# Patient Record
Sex: Female | Born: 1985 | Race: White | Hispanic: No | Marital: Married | Smoking: Never smoker
Health system: Southern US, Community
[De-identification: ages and names within clinical notes are randomized; demographics above are authoritative.]

---
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
## 2005-03-15 ENCOUNTER — Ambulatory Visit (HOSPITAL_COMMUNITY): Admission: RE | Admit: 2005-03-15 | Discharge: 2005-03-15 | Payer: Self-pay | Admitting: Family Medicine

## 2005-03-15 ENCOUNTER — Ambulatory Visit: Payer: Self-pay | Admitting: Cardiology

## 2006-08-23 ENCOUNTER — Ambulatory Visit (HOSPITAL_COMMUNITY): Admission: RE | Admit: 2006-08-23 | Discharge: 2006-08-23 | Payer: Self-pay | Admitting: Obstetrics & Gynecology

## 2006-09-02 ENCOUNTER — Encounter: Admission: RE | Admit: 2006-09-02 | Discharge: 2006-09-02 | Payer: Self-pay | Admitting: Gastroenterology

## 2007-03-16 ENCOUNTER — Other Ambulatory Visit: Admission: RE | Admit: 2007-03-16 | Discharge: 2007-03-16 | Payer: Self-pay | Admitting: Obstetrics & Gynecology

## 2008-03-08 IMAGING — US US PELVIS COMPLETE MODIFY
1 series · 14 of 25 positions shown · non-contrast
Comparison: none

CLINICAL DATA: 20-year-old with left lower quadrant pain.  Dysfunctional uterine bleeding, dyspareunia.  LMP began 07/20/06 and has continued until today.
 TRANSABDOMINAL AND TRANSVAGINAL PELVIC ULTRASOUND:
TECHNIQUE: Both transabdominal and transvaginal ultrasound examinations of the pelvis were performed including evaluation of the uterus, ovaries, adnexal regions, and pelvic cul-de-sac.

[Series 1: us pelvis complete modify · 0.26mm/px · 14 of 25 slices shown]
[im 1/25]
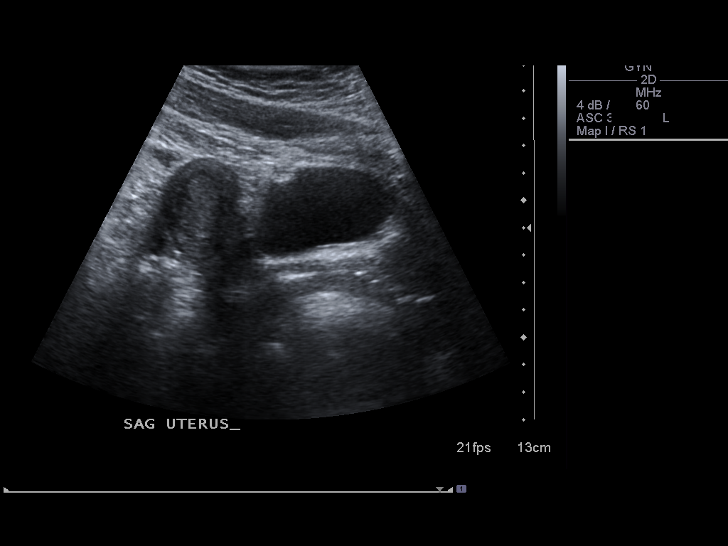
[im 3/25]
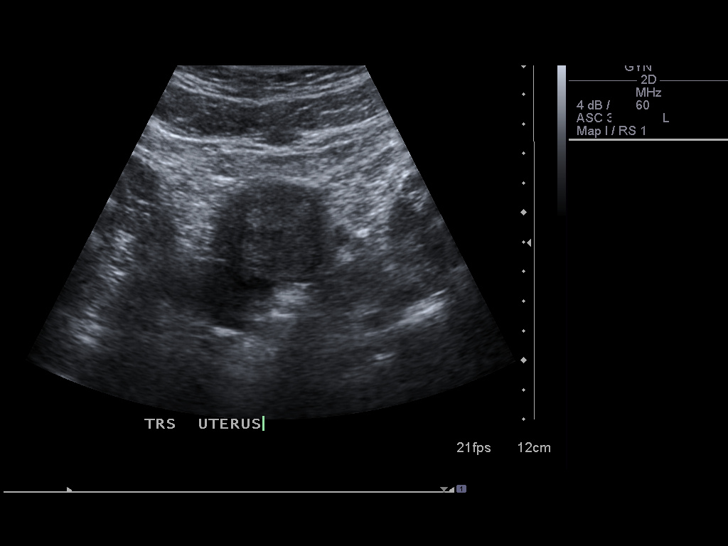
[im 5/25]
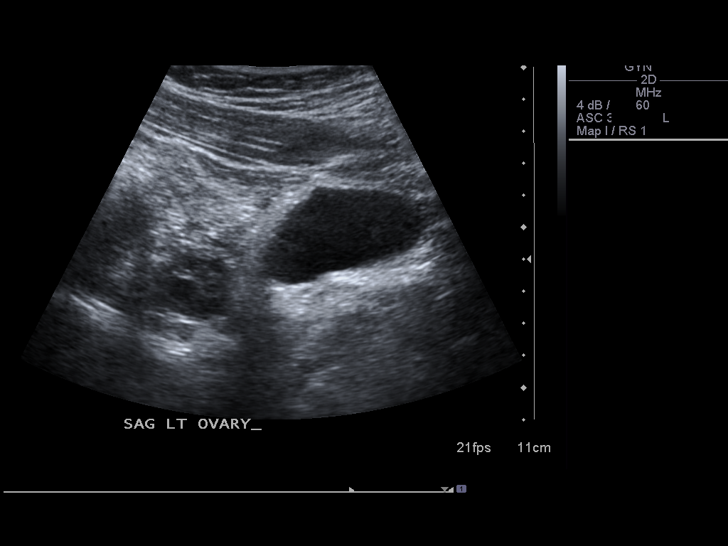
[im 7/25]
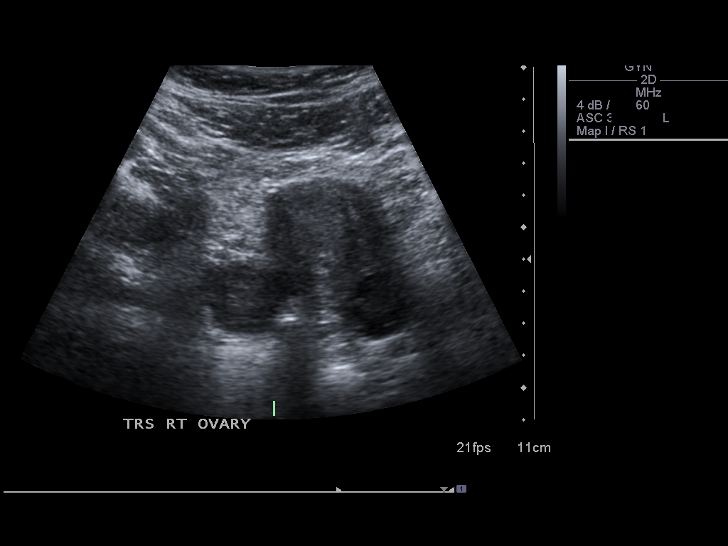
[im 9/25]
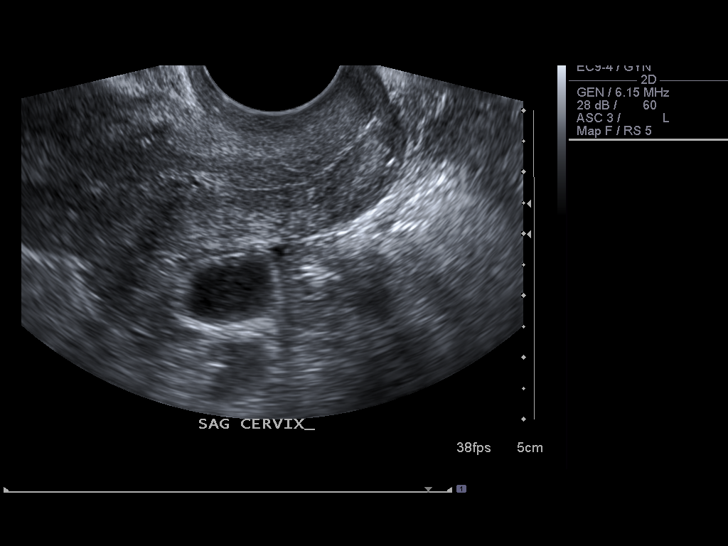
[im 10/25]
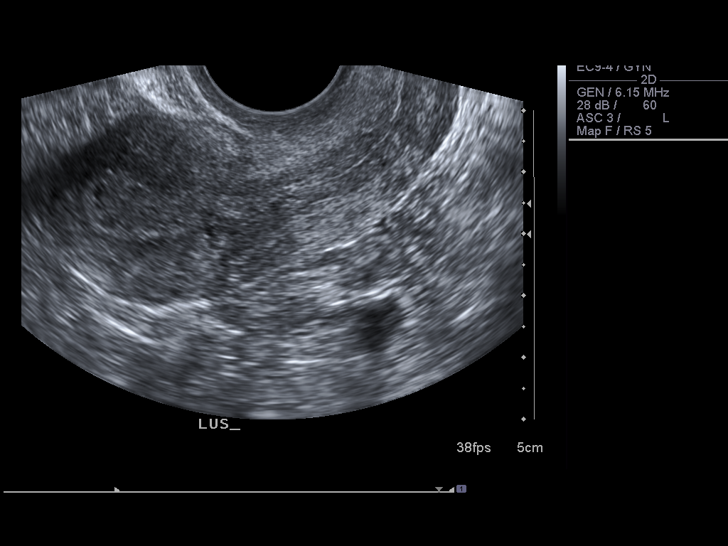
[im 12/25]
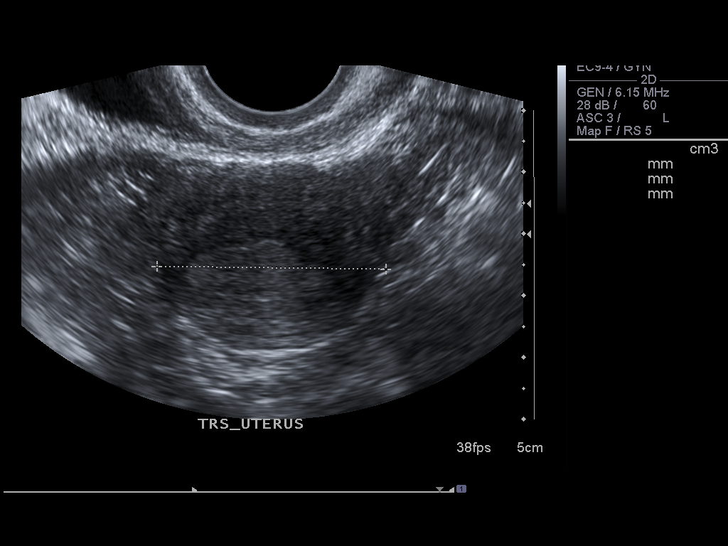
[im 14/25]
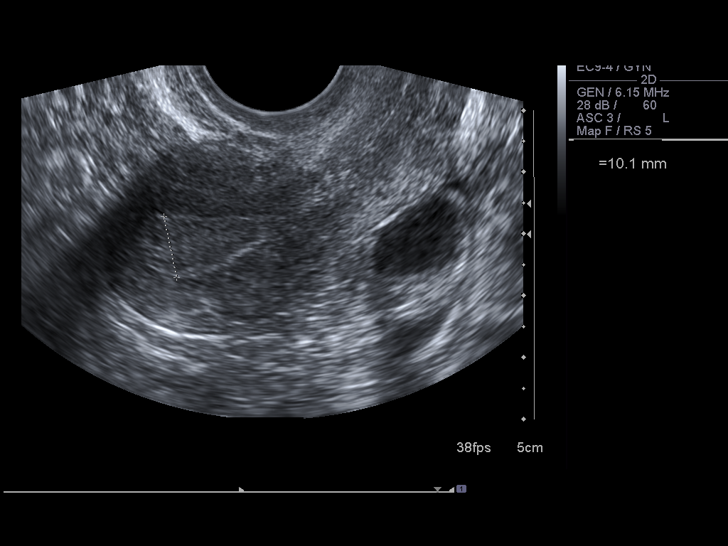
[im 16/25]
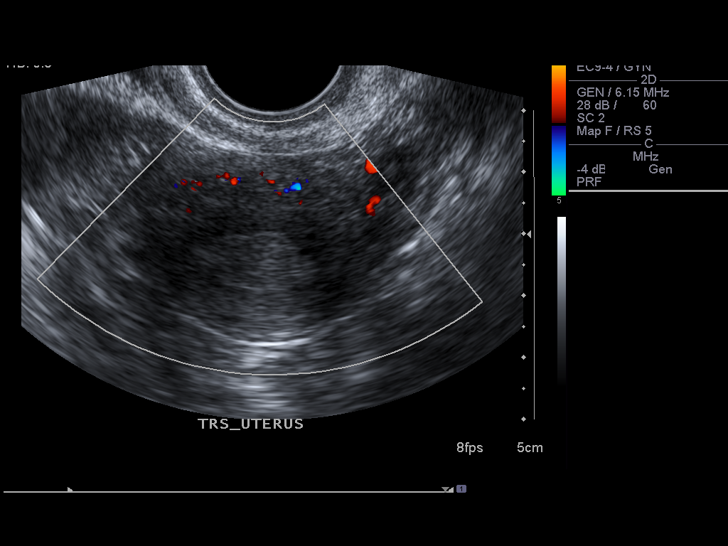
[im 17/25]
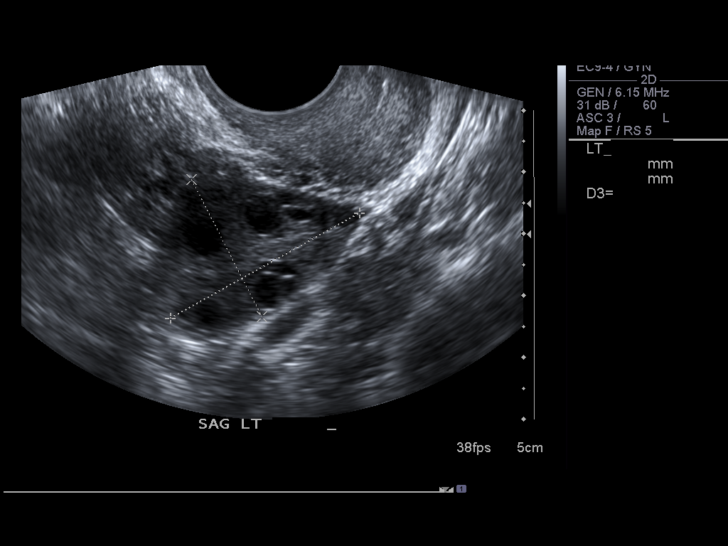
[im 19/25]
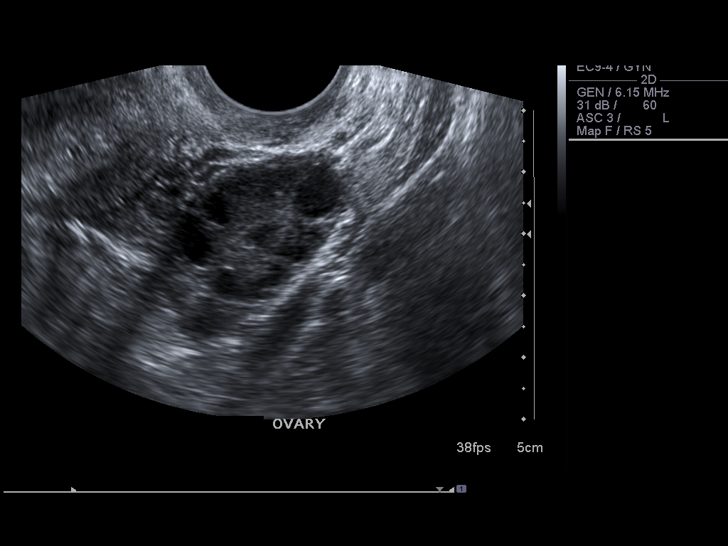
[im 21/25]
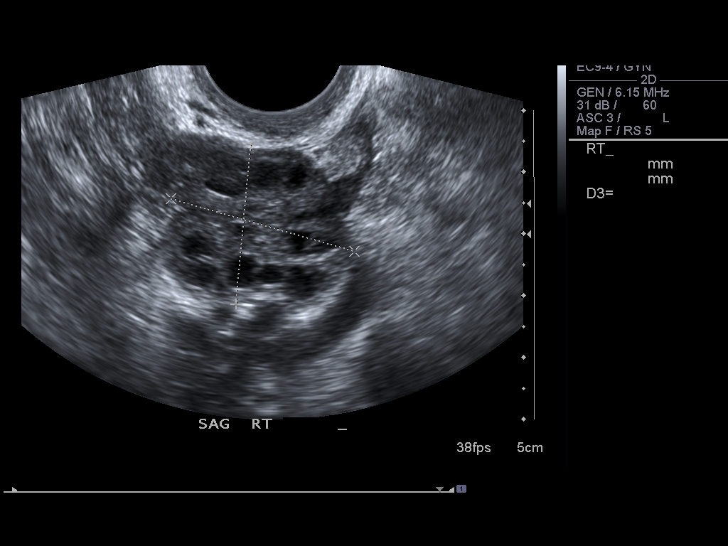
[im 23/25]
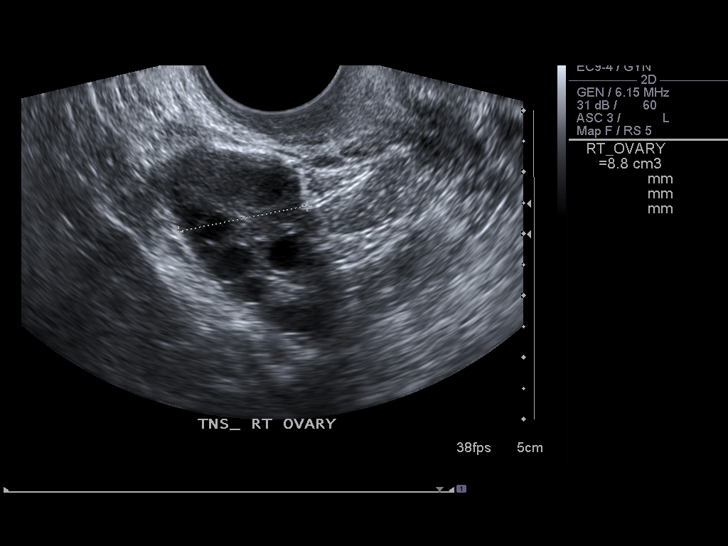
[im 25/25]
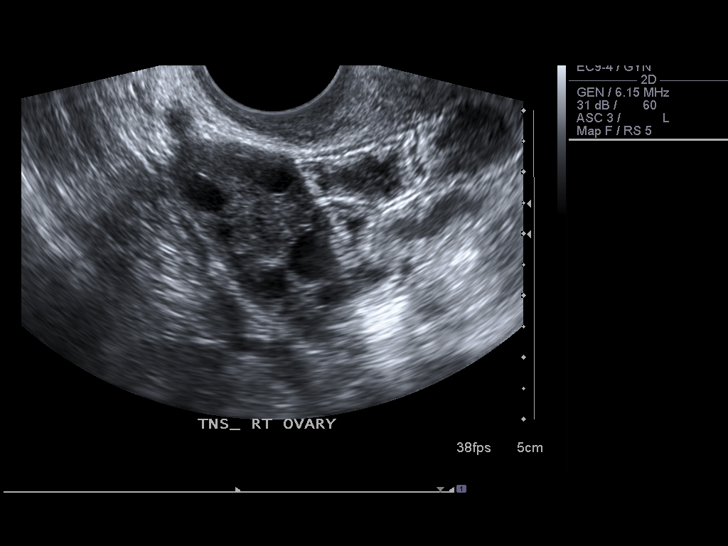

[14 of 25 positions shown; findings below may reference images not displayed]

FINDINGS: The uterus is 7.3 x 2.9 x 3.7 cm.  The endometrial stripe appears homogeneous, but is 10 mm, slightly prominent given the current bleeding.  The right ovary is 3.1 x 2.6 x 3.1 cm.  Left ovary is 3.5 x 2.5 x 2.3 cm.  The appearance of the ovaries does suggest possible polycystic ovary syndrome, but as this is a clinical diagnosis, correlation is suggested.  No free pelvic fluid is identified.
IMPRESSION: 1.  The endometrium appears thickened given the history of persistent vaginal bleeding.  Consider further evaluation with sonohysterogram. 
 2.  The appearance of the ovaries suggest polycystic ovaries.  Clinical correlation is suggested regarding this finding.

## 2008-03-18 IMAGING — CT CT ABDOMEN W/ CM
2 of 5 series · 17 of 46 positions shown, 19 images · IV contrast (READICAT/WATER & [ID] OMNI 300)
Comparison: Ultrasound pelvis 08/23/06. 
 CT ABDOMEN WITH CONTRAST:

CLINICAL DATA: Left lower quadrant inguinal pain for several weeks.  Nausea and vomiting.  
 CT ABDOMEN AND PELVIS WITH CONTRAST:
TECHNIQUE: Multidetector CT imaging of the abdomen and pelvis was performed following the standard protocol during bolus administration of intravenous contrast.
 Contrast:  822cc Omnipaque 300.

[Series 2: a&p w/ · axial · 0.64mm/px · z∈[-324,+16]mm · 14 of 77 slices shown, 16 images]
[im 5/77  soft-tissue]
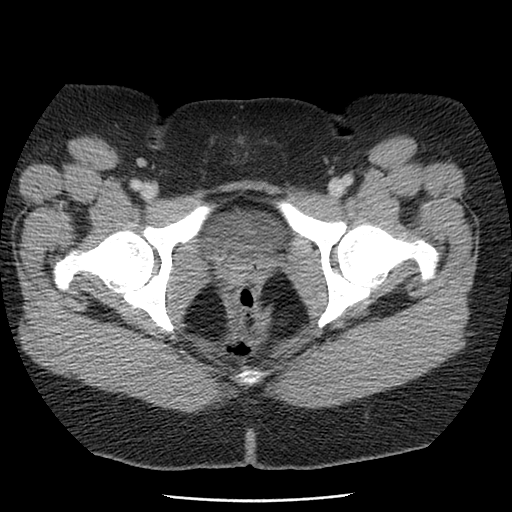
[im 5/77  bone]
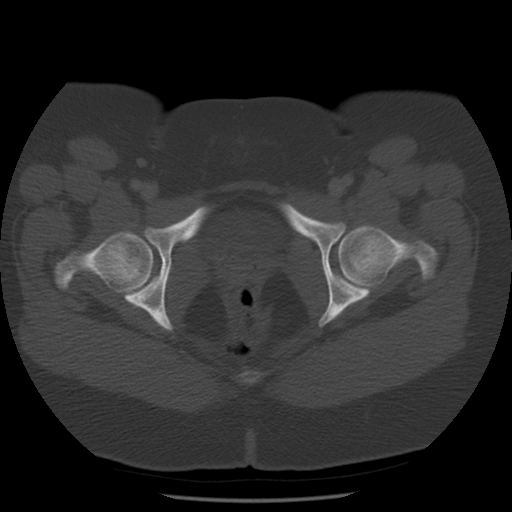
[im 9/77  soft-tissue]
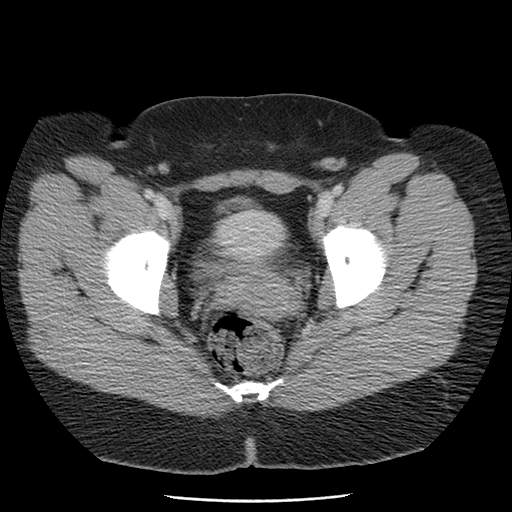
[im 17/77  soft-tissue]
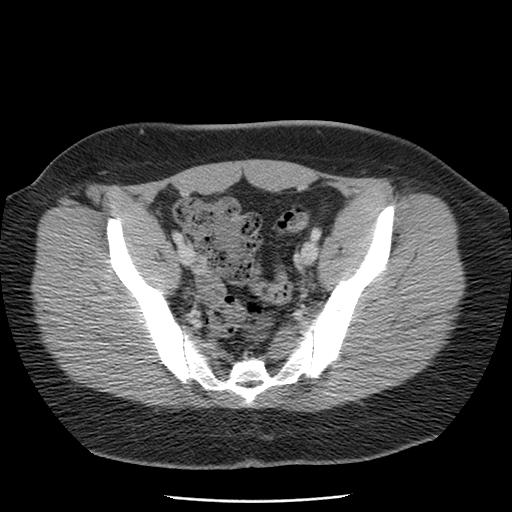
[im 21/77  soft-tissue]
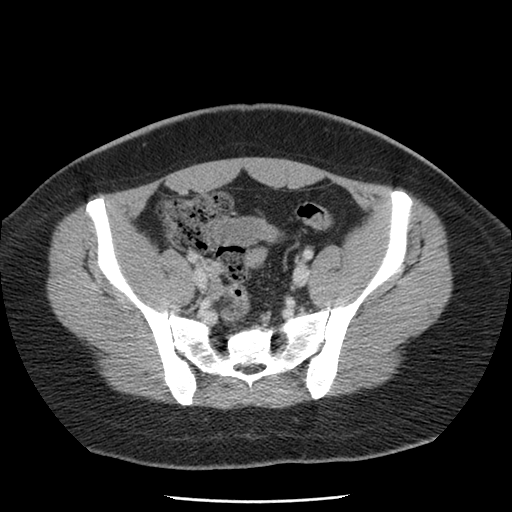
[im 25/77  soft-tissue]
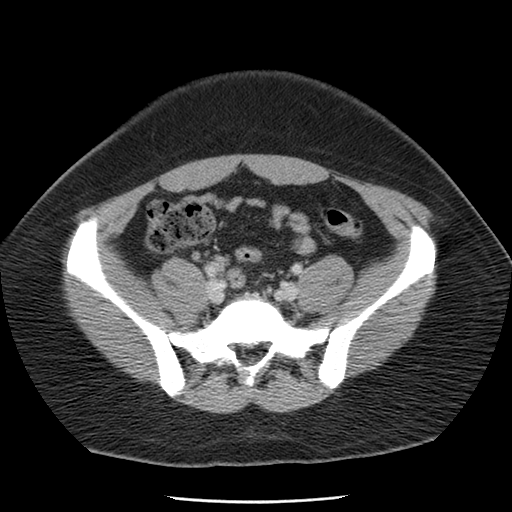
[im 33/77  soft-tissue]
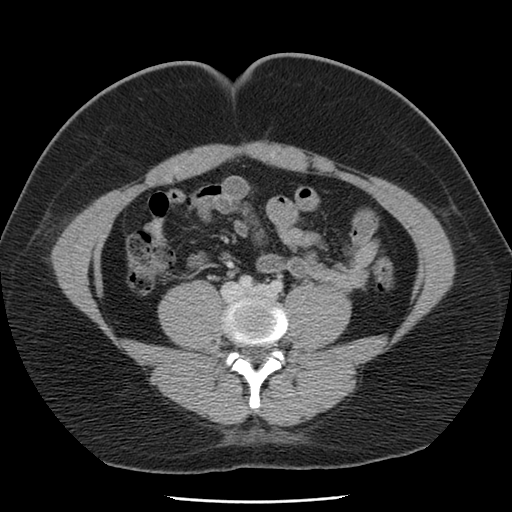
[im 37/77  soft-tissue]
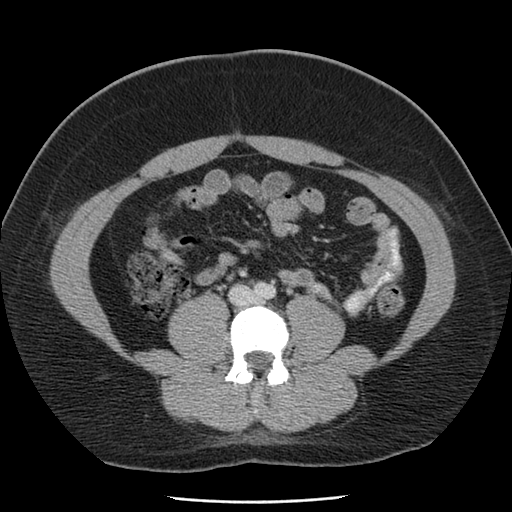
[im 41/77  soft-tissue]
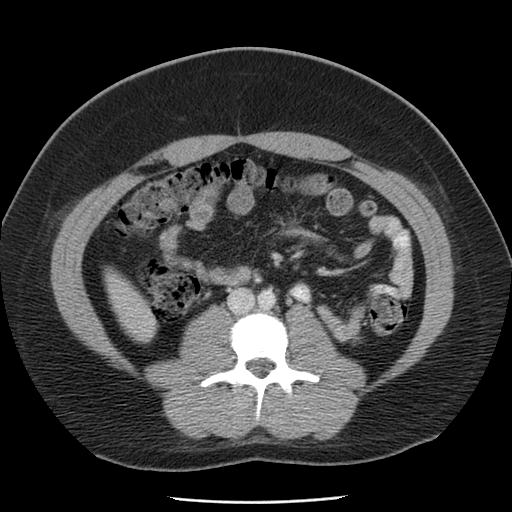
[im 45/77  soft-tissue]
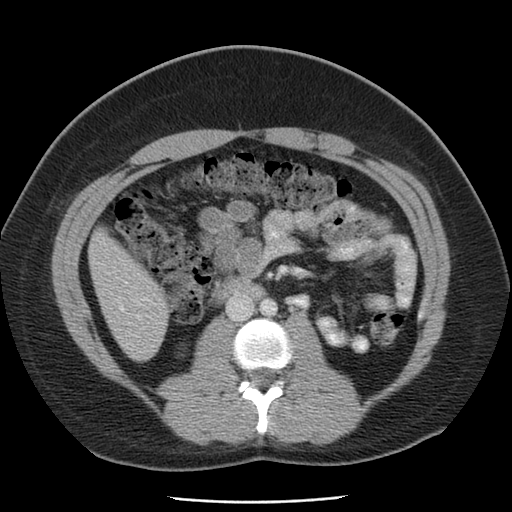
[im 45/77  bone]
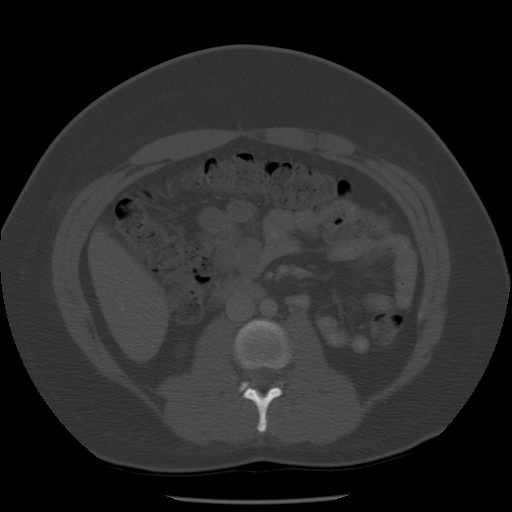
[im 53/77  soft-tissue]
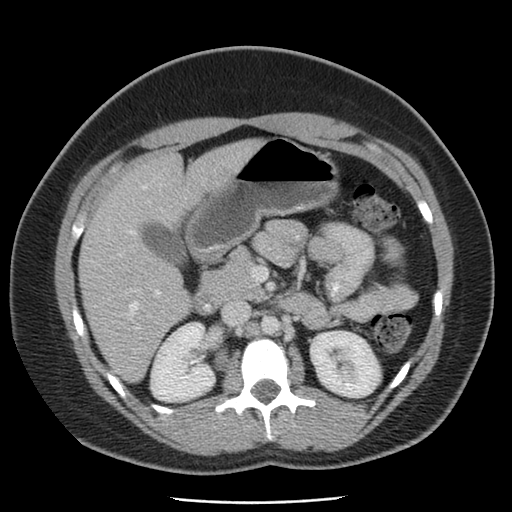
[im 57/77  soft-tissue]
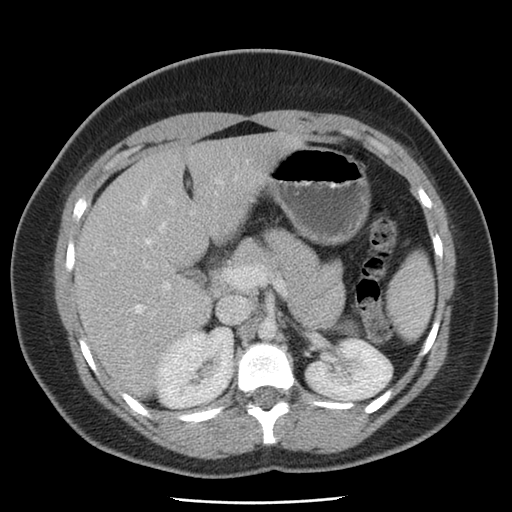
[im 61/77  soft-tissue]
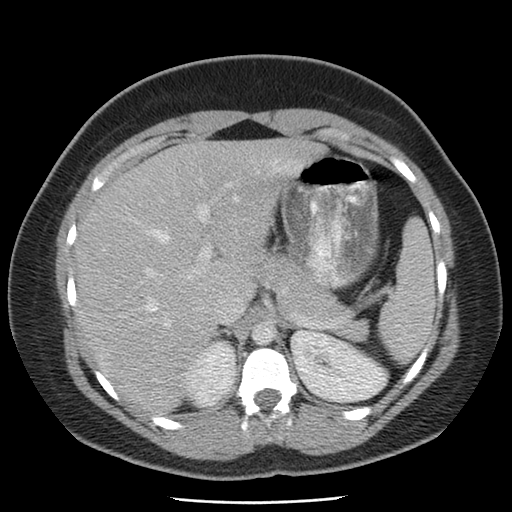
[im 69/77  soft-tissue]
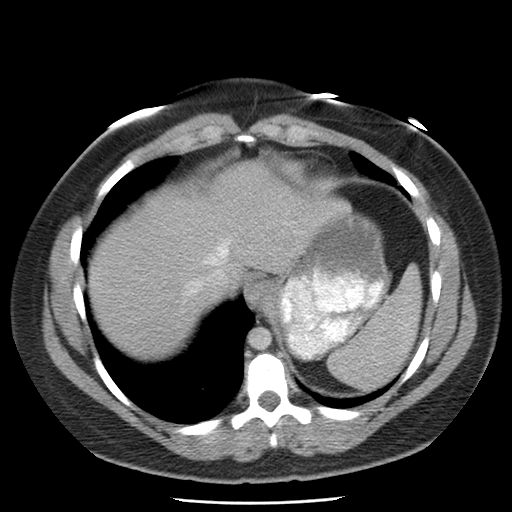
[im 73/77  soft-tissue]
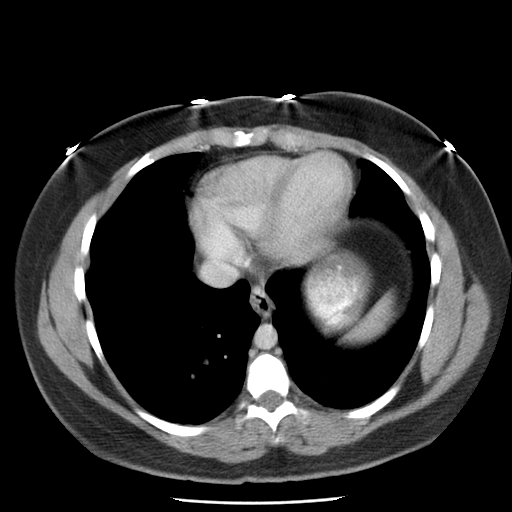

[Series 400: cor abd · coronal · 0.82mm/px · 3 of 98 slices shown]
[im 33/98  soft-tissue]
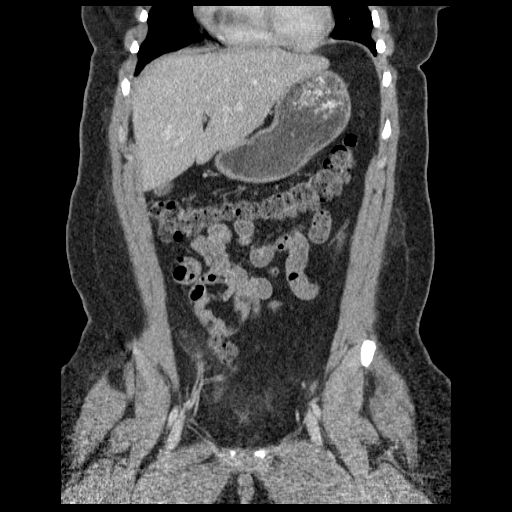
[im 44/98  soft-tissue]
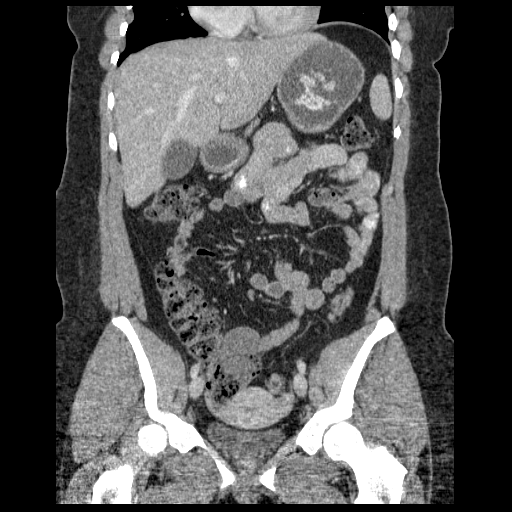
[im 54/98  soft-tissue]
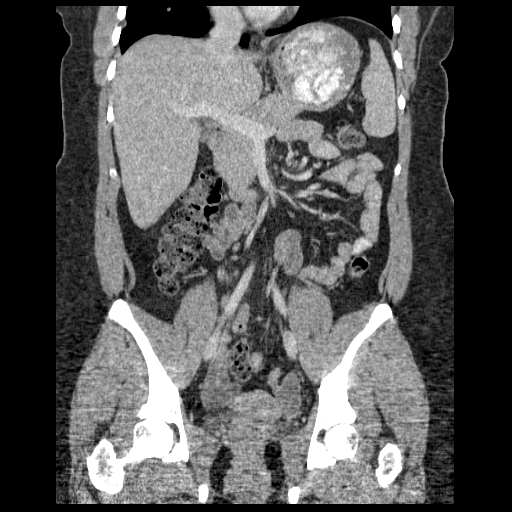

[17 of 46 positions shown; findings below may reference images not displayed]

FINDINGS: Lung bases are clear.  Heart size is normal.  There is no pericardial or pleural effusion.  
 The liver, spleen, stomach, pancreas, gallbladder, adrenal glands, kidneys normal.  No retroperitoneal or retrocrural adenopathy.  
 The colon is normal.  The appendix is well seen on image 52 and is within normal limits.  Small bowel is normal and there is no ascites.
IMPRESSION: No acute process in the abdomen. 
 CT PELVIS WITH CONTRAST:
FINDINGS: Pelvic large and small bowel are normal.  No pelvic adenopathy. 
 The uterus and urinary bladder are normal.  The ovaries are upper limits of normal for size.  Trace free fluid is likely physiologic.  Bone windows demonstrate no worrisome osseous lesion.
IMPRESSION: No acute process in the pelvis.  Ovarian size upper limits of normal for age.

## 2008-07-10 ENCOUNTER — Other Ambulatory Visit: Admission: RE | Admit: 2008-07-10 | Discharge: 2008-07-10 | Payer: Self-pay | Admitting: Obstetrics & Gynecology

## 2009-10-16 ENCOUNTER — Encounter: Payer: Self-pay | Admitting: Obstetrics

## 2009-10-16 ENCOUNTER — Inpatient Hospital Stay (HOSPITAL_COMMUNITY): Admission: AD | Admit: 2009-10-16 | Discharge: 2009-10-22 | Payer: Self-pay | Admitting: Obstetrics & Gynecology

## 2009-10-18 ENCOUNTER — Encounter (INDEPENDENT_AMBULATORY_CARE_PROVIDER_SITE_OTHER): Payer: Self-pay | Admitting: Obstetrics & Gynecology

## 2009-10-23 ENCOUNTER — Encounter: Admission: RE | Admit: 2009-10-23 | Discharge: 2009-11-22 | Payer: Self-pay | Admitting: Obstetrics

## 2009-11-23 ENCOUNTER — Encounter: Admission: RE | Admit: 2009-11-23 | Discharge: 2009-12-23 | Payer: Self-pay | Admitting: Obstetrics

## 2009-12-24 ENCOUNTER — Encounter: Admission: RE | Admit: 2009-12-24 | Discharge: 2010-01-15 | Payer: Self-pay | Admitting: Obstetrics

## 2010-01-24 ENCOUNTER — Encounter: Admission: RE | Admit: 2010-01-24 | Discharge: 2010-02-10 | Payer: Self-pay | Admitting: Obstetrics

## 2010-06-25 ENCOUNTER — Inpatient Hospital Stay (HOSPITAL_COMMUNITY): Admission: AD | Admit: 2010-06-25 | Discharge: 2009-08-18 | Payer: Self-pay | Admitting: Obstetrics

## 2010-08-09 ENCOUNTER — Encounter: Payer: Self-pay | Admitting: Obstetrics

## 2010-10-05 LAB — URINE MICROSCOPIC-ADD ON

## 2010-10-05 LAB — URINALYSIS, ROUTINE W REFLEX MICROSCOPIC
Bilirubin Urine: NEGATIVE
Glucose, UA: NEGATIVE mg/dL
Ketones, ur: NEGATIVE mg/dL
Leukocytes, UA: NEGATIVE
Nitrite: NEGATIVE
Protein, ur: NEGATIVE mg/dL
Specific Gravity, Urine: 1.025 (ref 1.005–1.030)
Urobilinogen, UA: 0.2 mg/dL (ref 0.0–1.0)
pH: 6 (ref 5.0–8.0)

## 2010-10-07 LAB — CBC
HCT: 31.1 % — ABNORMAL LOW (ref 36.0–46.0)
HCT: 35.2 % — ABNORMAL LOW (ref 36.0–46.0)
Hemoglobin: 10.5 g/dL — ABNORMAL LOW (ref 12.0–15.0)
Hemoglobin: 11.9 g/dL — ABNORMAL LOW (ref 12.0–15.0)
MCHC: 33.7 g/dL (ref 30.0–36.0)
MCHC: 33.8 g/dL (ref 30.0–36.0)
MCV: 94 fL (ref 78.0–100.0)
MCV: 95.1 fL (ref 78.0–100.0)
Platelets: 218 10*3/uL (ref 150–400)
Platelets: 243 10*3/uL (ref 150–400)
RBC: 3.27 MIL/uL — ABNORMAL LOW (ref 3.87–5.11)
RBC: 3.74 MIL/uL — ABNORMAL LOW (ref 3.87–5.11)
RDW: 16 % — ABNORMAL HIGH (ref 11.5–15.5)
RDW: 16.2 % — ABNORMAL HIGH (ref 11.5–15.5)
WBC: 22 10*3/uL — ABNORMAL HIGH (ref 4.0–10.5)
WBC: 23.5 10*3/uL — ABNORMAL HIGH (ref 4.0–10.5)

## 2010-10-07 LAB — URINE CULTURE
Colony Count: NO GROWTH
Culture: NO GROWTH
Special Requests: NEGATIVE

## 2010-10-07 LAB — TYPE AND SCREEN
ABO/RH(D): A POS
Antibody Screen: NEGATIVE

## 2010-10-12 LAB — COMPREHENSIVE METABOLIC PANEL
AST: 22 U/L (ref 0–37)
Albumin: 3 g/dL — ABNORMAL LOW (ref 3.5–5.2)
Alkaline Phosphatase: 93 U/L (ref 39–117)
Chloride: 105 mEq/L (ref 96–112)
GFR calc Af Amer: >60 mL/min (ref >60)
Potassium: 4.3 mEq/L (ref 3.5–5.1)
Sodium: 136 mEq/L (ref 135–145)
Total Bilirubin: 0.4 mg/dL (ref 0.3–1.2)

## 2010-10-12 LAB — PROTEIN, URINE, 24 HOUR
Collection Interval-UPROT: 24 hours
Protein, 24H Urine: 92 mg/d (ref 50–100)
Protein, Urine: 8 mg/dL
Urine Total Volume-UPROT: 1150 mL

## 2010-10-12 LAB — URINE CULTURE

## 2010-10-12 LAB — STREP B DNA PROBE: Strep Group B Ag: NEGATIVE

## 2010-10-12 LAB — CREATININE CLEARANCE, URINE, 24 HOUR
Collection Interval-CRCL: 24 hours
Creatinine Clearance: 128 mL/min — ABNORMAL HIGH (ref 75–115)
Creatinine, 24H Ur: 1273 mg/d (ref 700–1800)
Creatinine, Urine: 110.7 mg/dL
Creatinine: 0.69 mg/dL (ref 0.4–1.2)
Urine Total Volume-CRCL: 1150 mL

## 2010-10-12 LAB — CBC
Platelets: 237 10*3/uL (ref 150–400)
WBC: 13.5 10*3/uL — ABNORMAL HIGH (ref 4.0–10.5)

## 2010-10-12 LAB — ABO/RH: ABO/RH(D): A POS

## 2010-12-04 NOTE — Procedures (Signed)
NAMEMARIEA, MCMARTIN NO.:  [id number]   MEDICAL RECORD NO.:  [id number]          PATIENT TYPE:  OUT   LOCATION:  RAD                           FACILITY:  [facility]   PHYSICIAN:  Pima Bing, M.D. [facility] OF BIRTH:  Jul 19, 1986   DATE OF PROCEDURE:  03/15/2005  DATE OF DISCHARGE:                                  ECHOCARDIOGRAM   REFERRING PHYSICIAN:  Donna Bernard, M.D.   CLINICAL DATA:  A 25 year old woman with chest pain, dyspnea and murmur.   M-MODE TRACINGS:  Aorta 2.4, left atrium 3.0, septum 1.0, posterior wall  1.1, LV diastole 3.1, LV systole 2.4.   FINDINGS:  1.  Technically adequate echocardiographic study.  2.  Normal left atrium, right atrium and right ventricle.  3.  Normal aortic, mitral, tricuspid and pulmonic valves; normal proximal      pulmonary artery.  4.  Normal Doppler examination; physiologic tricuspid regurgitation; right      ventricular systolic pressure not optimally defined, appears normal.  5.  Normal internal dimension, wall thickness, regional and global function      of the left ventricle.  6.  Inferior vena cava not well imaged, appears normal.      Lismore Bing, M.D. [facility]  Electronically Signed     RR/MEDQ  D:  03/15/2005  T:  03/16/2005  Job:  [id number]

## 2011-05-02 IMAGING — US US OB UMBILICAL ART DOPPLER
1 series · 14 of 16 positions shown · non-contrast
Comparison: none

OBSTETRICAL ULTRASOUND:
 This ultrasound was performed in The [HOSPITAL], and the [facility] OB/GYN report will be stored to [REDACTED] PACS.  This report is also available in [HOSPITAL]?s accessANYware.

[Series 1: us ob umbilical art doppler · 14 of 16 slices shown]
[im 1/16]
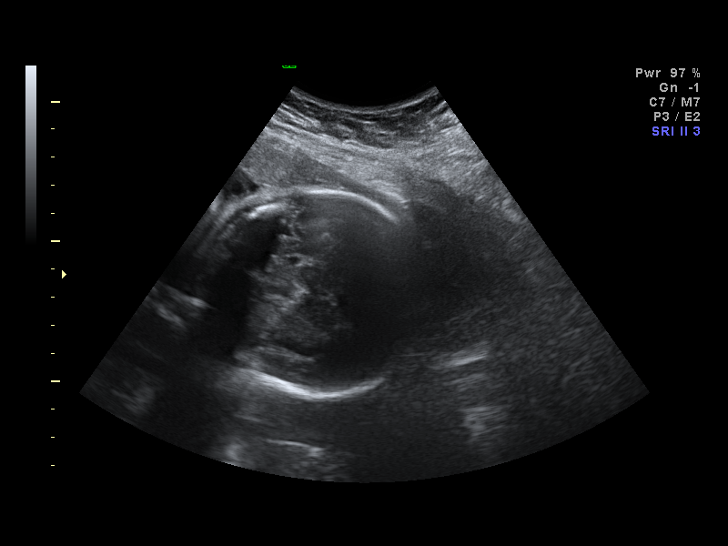
[im 2/16]
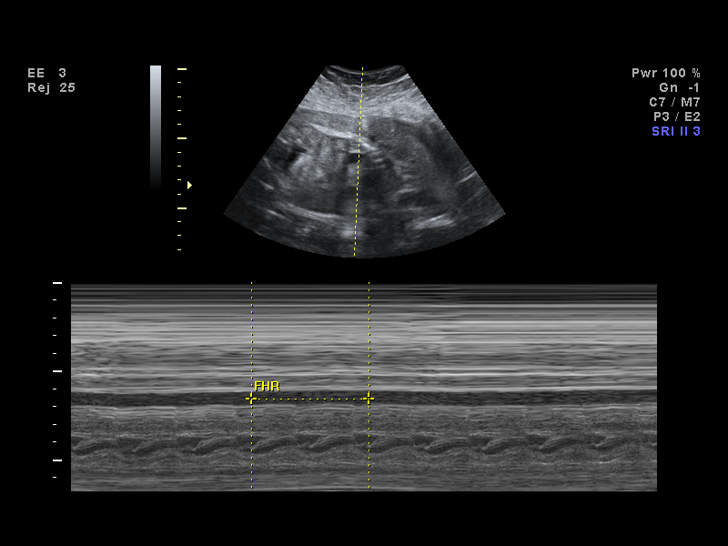
[im 3/16]
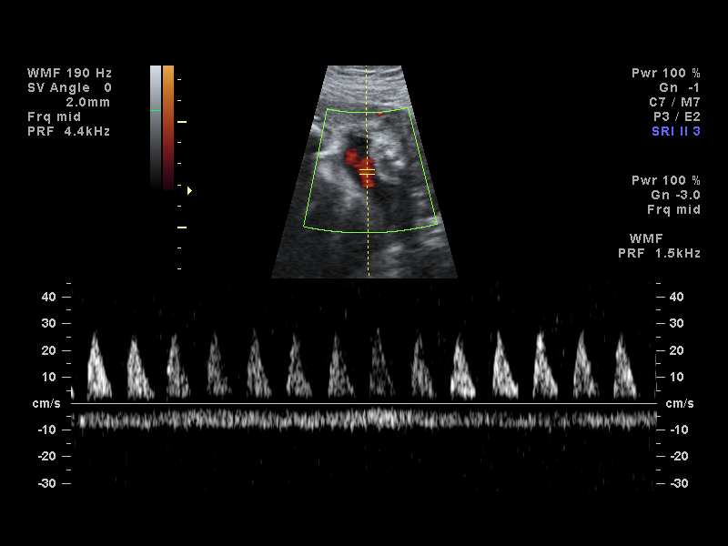
[im 5/16]
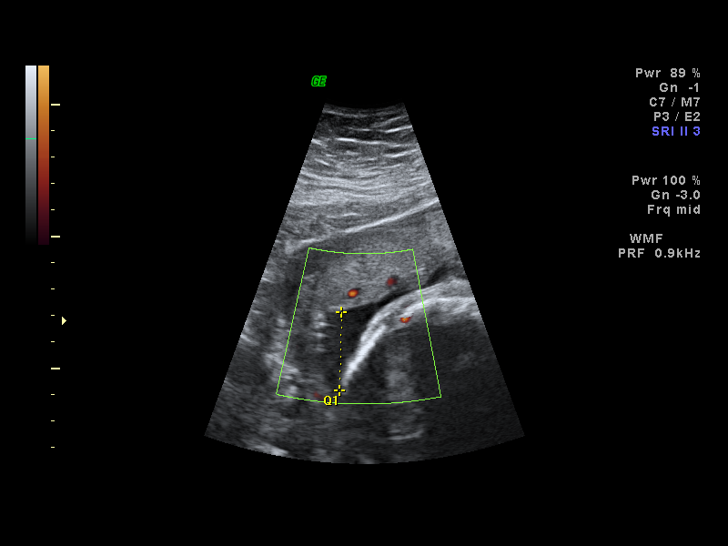
[im 6/16]
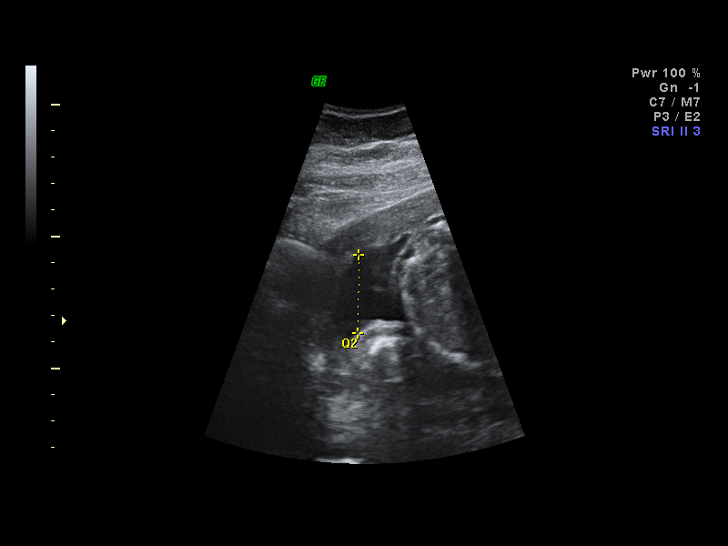
[im 7/16]
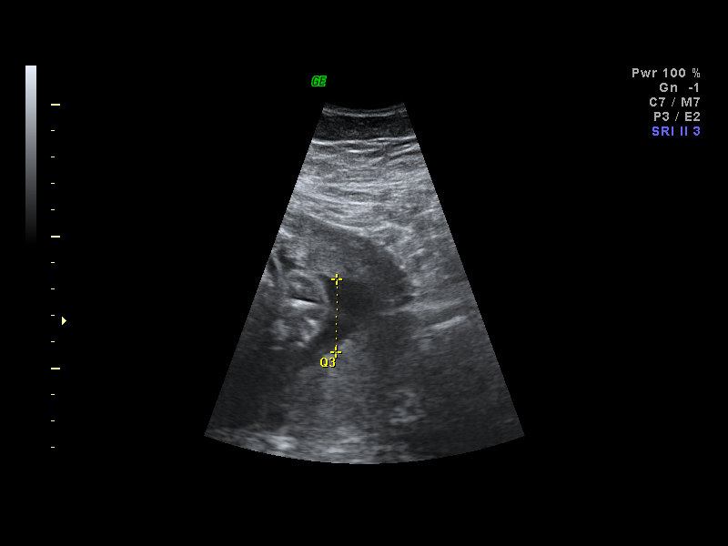
[im 8/16]
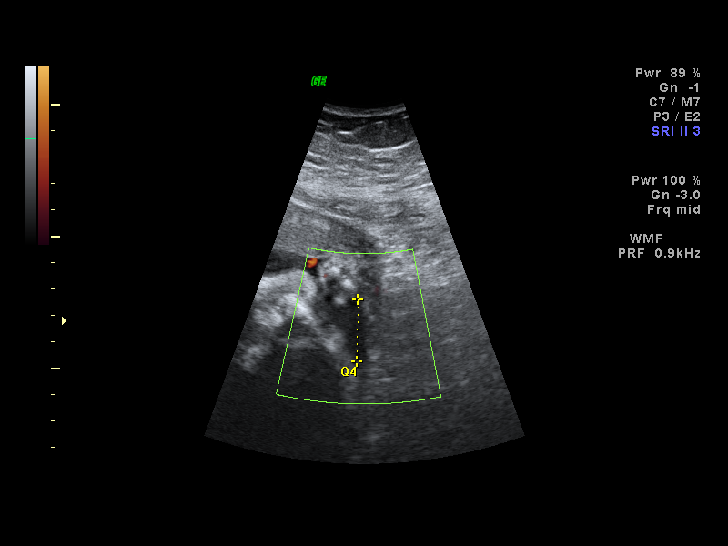
[im 9/16]
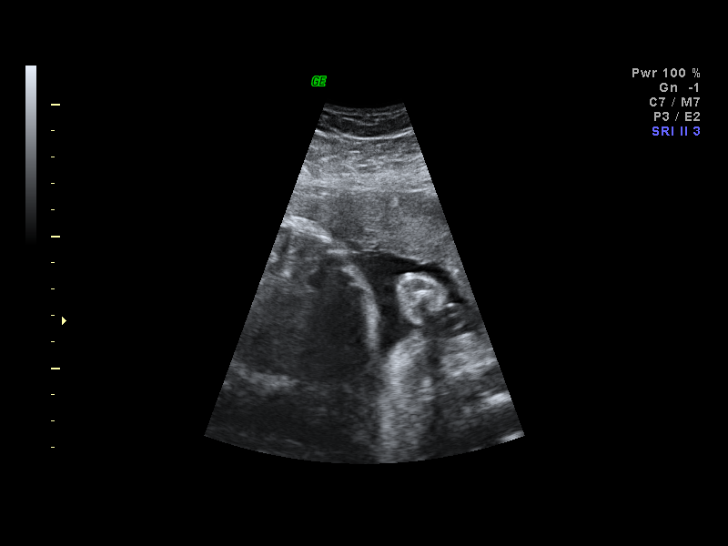
[im 10/16]
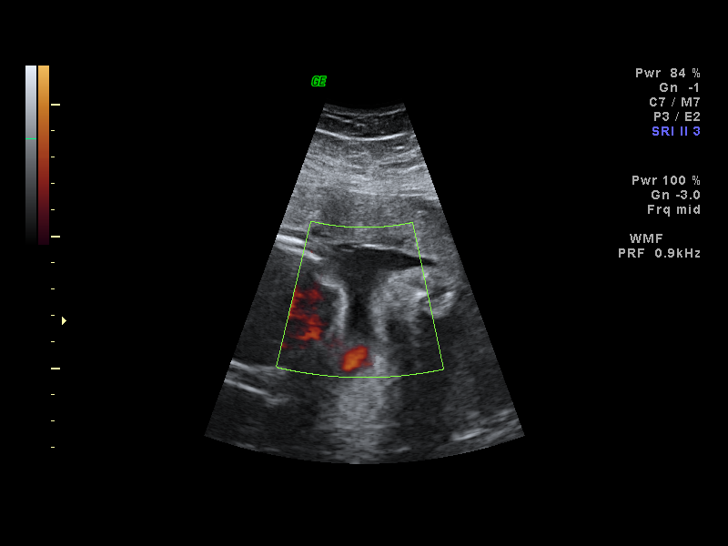
[im 11/16]
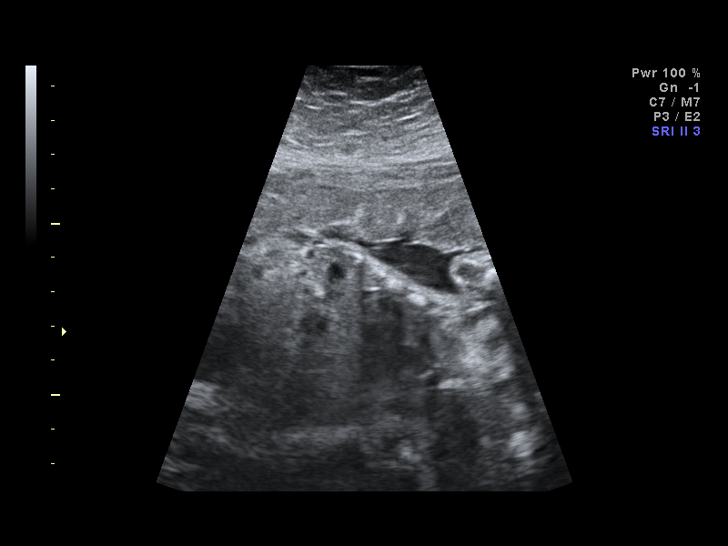
[im 13/16]
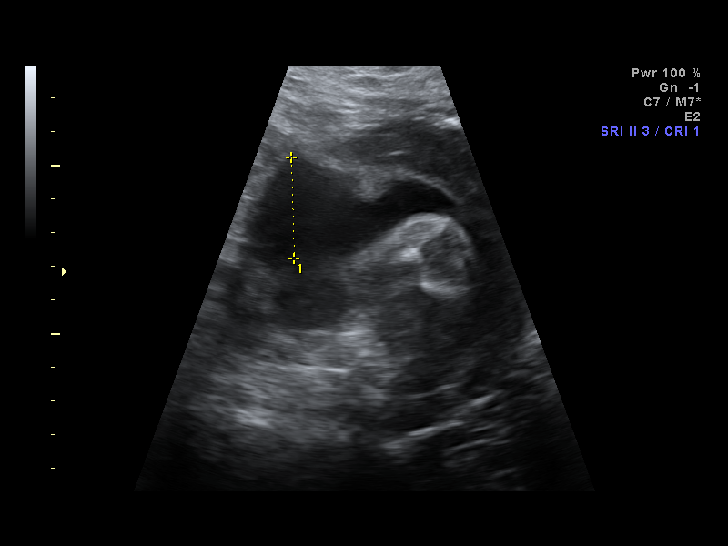
[im 14/16]
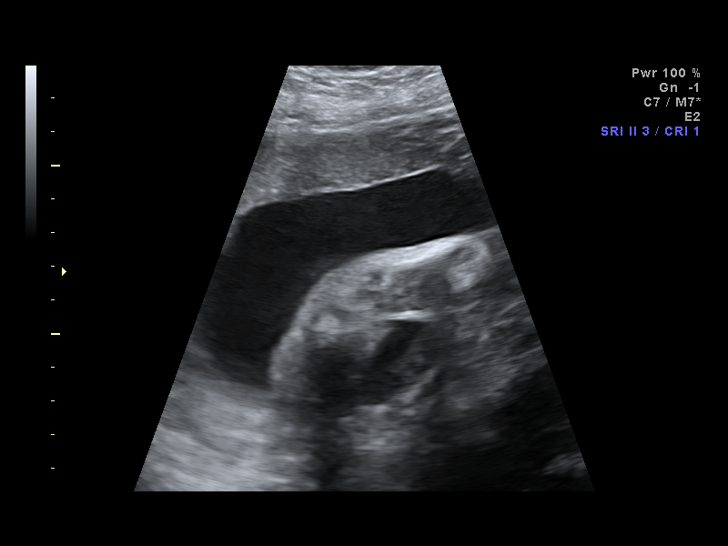
[im 15/16]
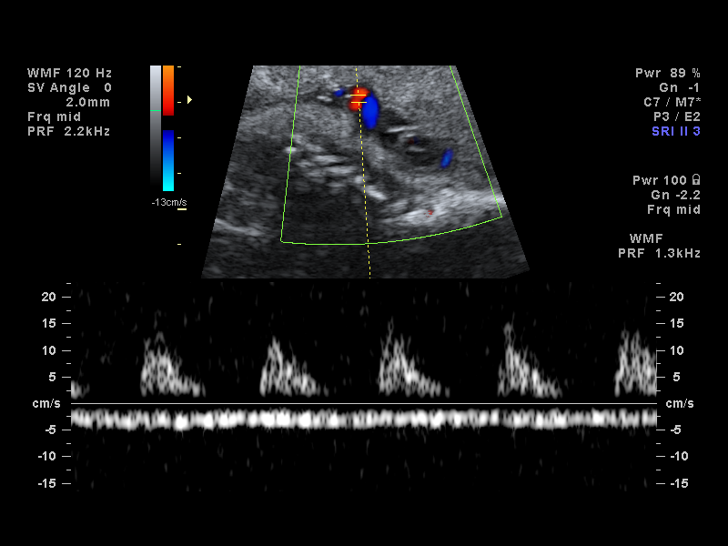
[im 16/16]
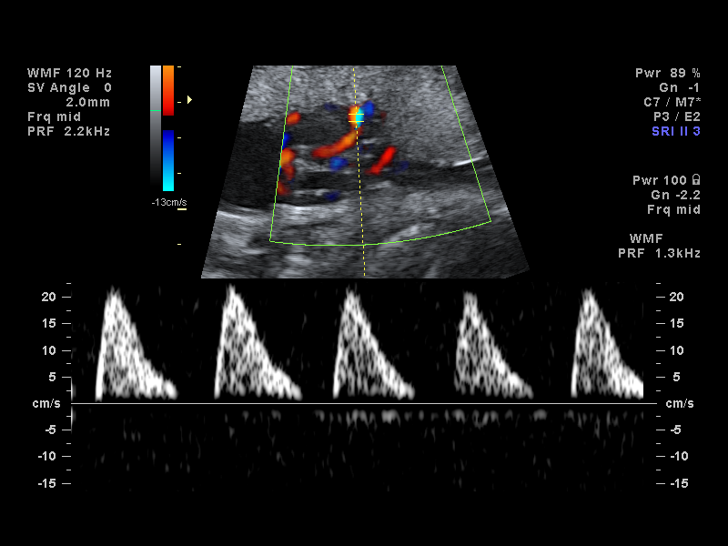

[14 of 16 positions shown; findings below may reference images not displayed]

IMPRESSION: [facility] OB/GYN has also been faxed to the ordering physician.

## 2011-05-04 IMAGING — US US UA DOPPLER RE-EVAL
1 series · 14 of 22 positions shown · non-contrast
Comparison: none

OBSTETRICAL ULTRASOUND:
 This ultrasound exam was performed in the [HOSPITAL] Ultrasound Department.  The OB US report was generated in the AS system, and faxed to the ordering physician.  This report is also available in [HOSPITAL]?s AccessANYware and in [REDACTED] PACS.

[Series 1: us ua doppler re-eval · 0.24mm/px · 14 of 22 slices shown]
[im 1/22]
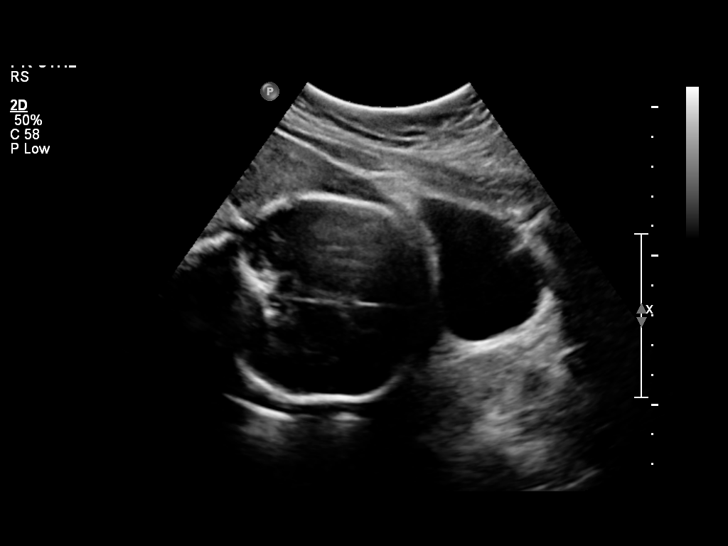
[im 3/22]
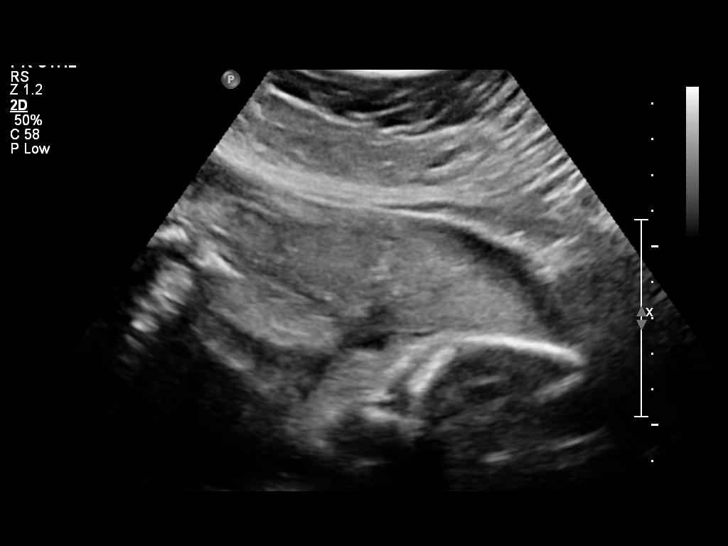
[im 4/22]
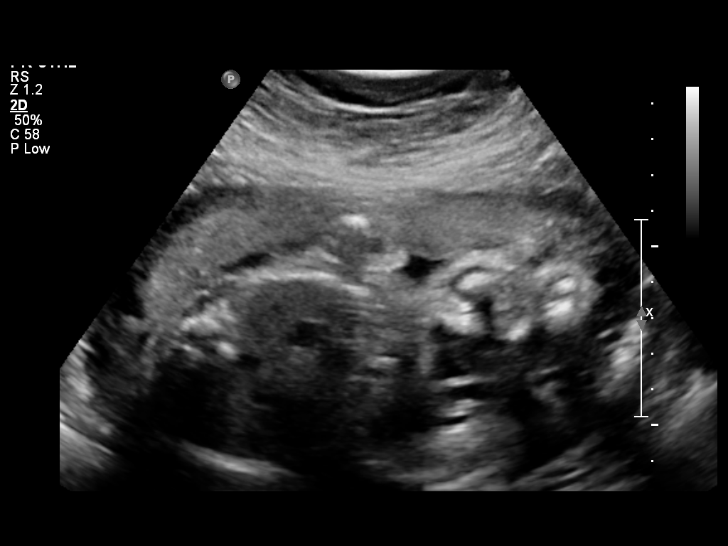
[im 6/22]
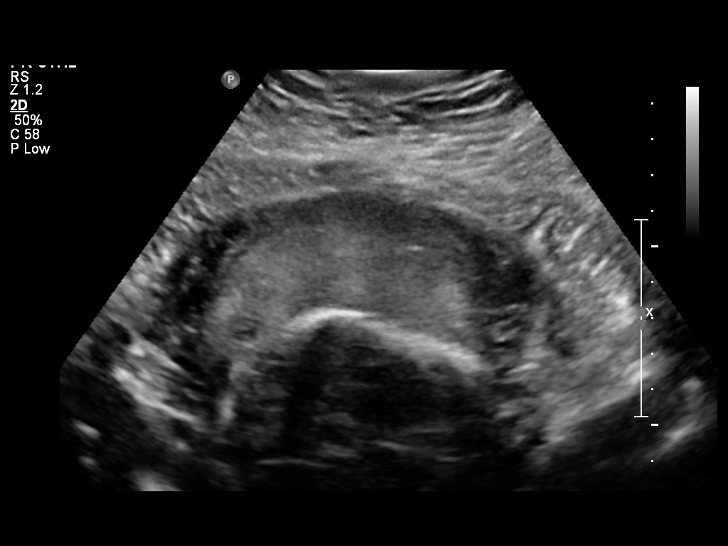
[im 8/22]
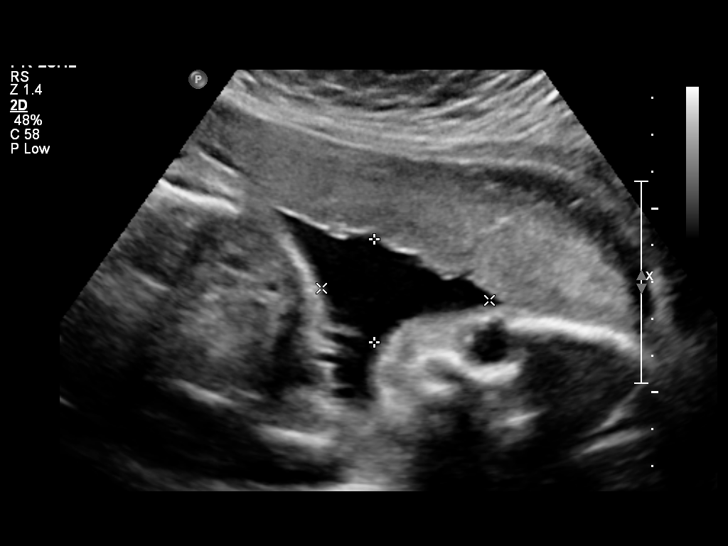
[im 9/22]
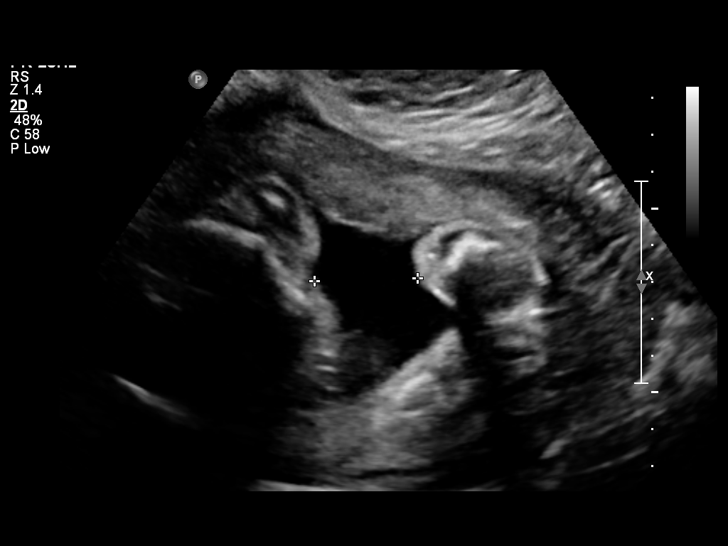
[im 11/22]
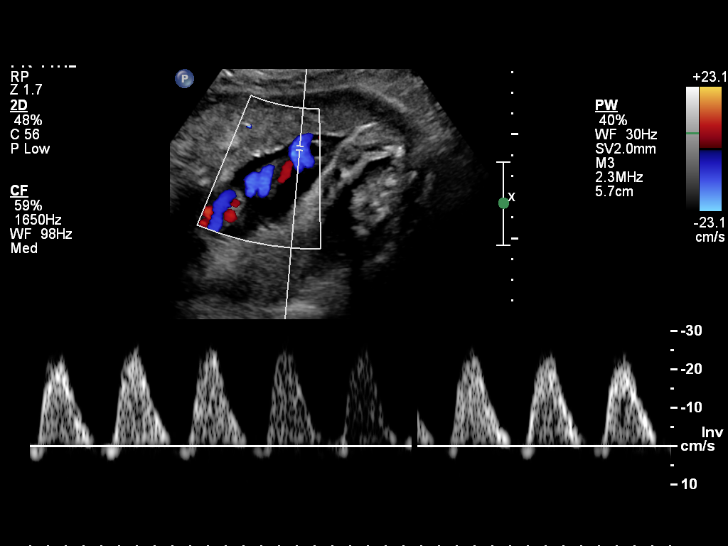
[im 12/22]
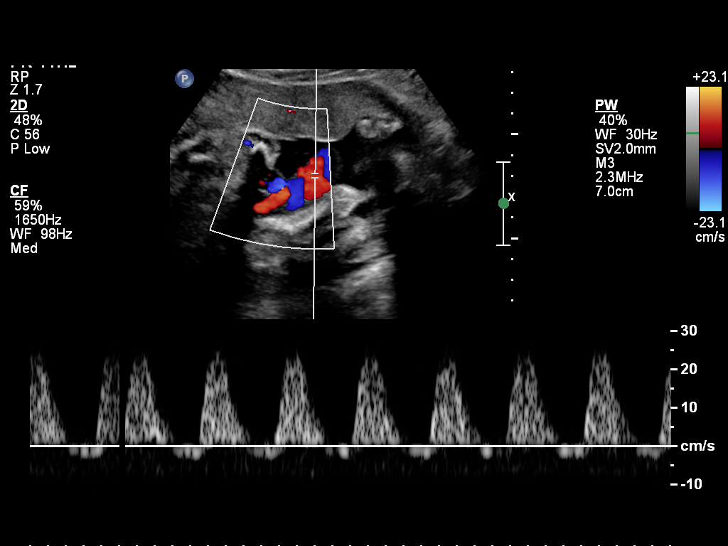
[im 14/22]
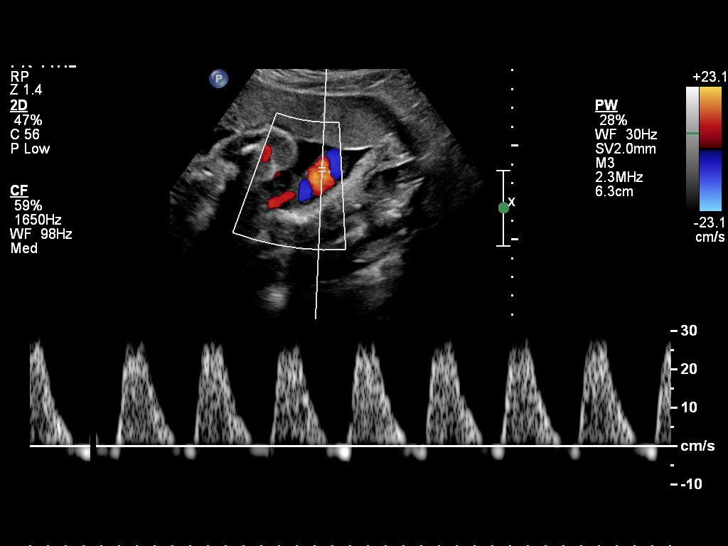
[im 15/22]
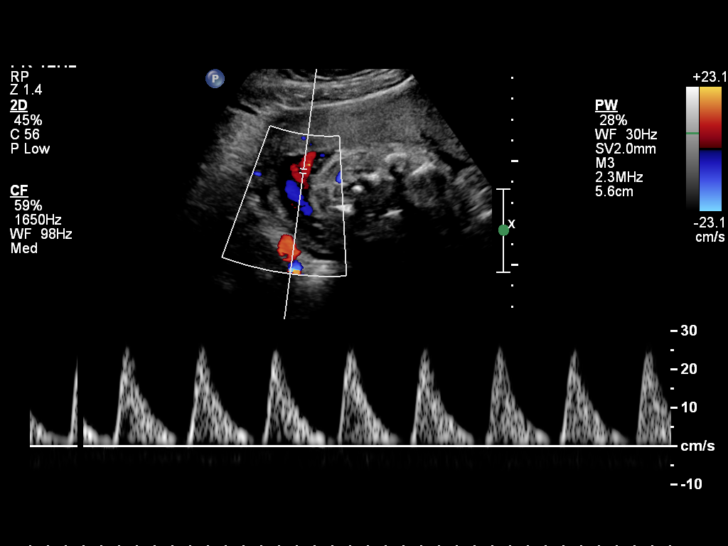
[im 17/22]
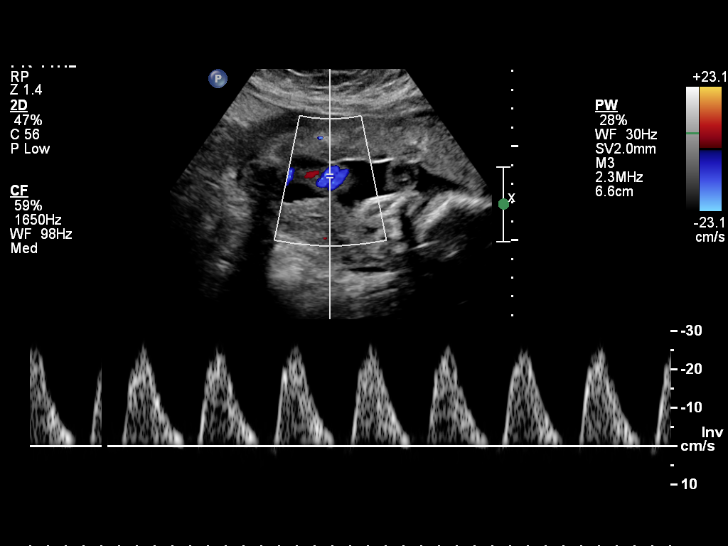
[im 19/22]
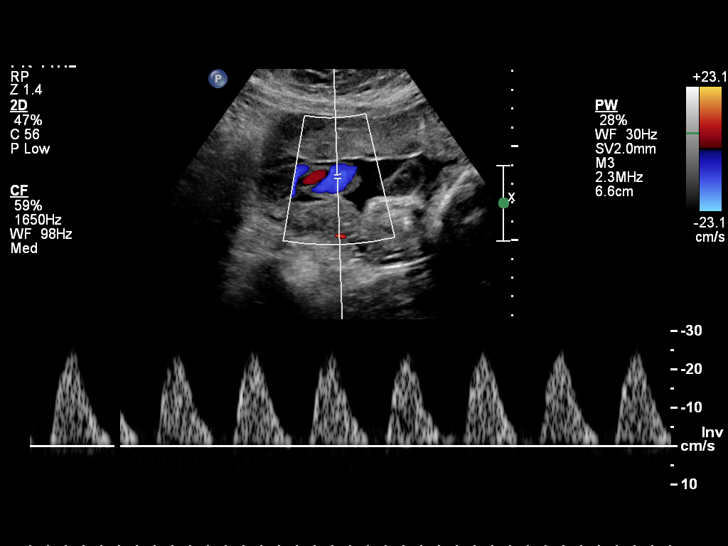
[im 20/22]
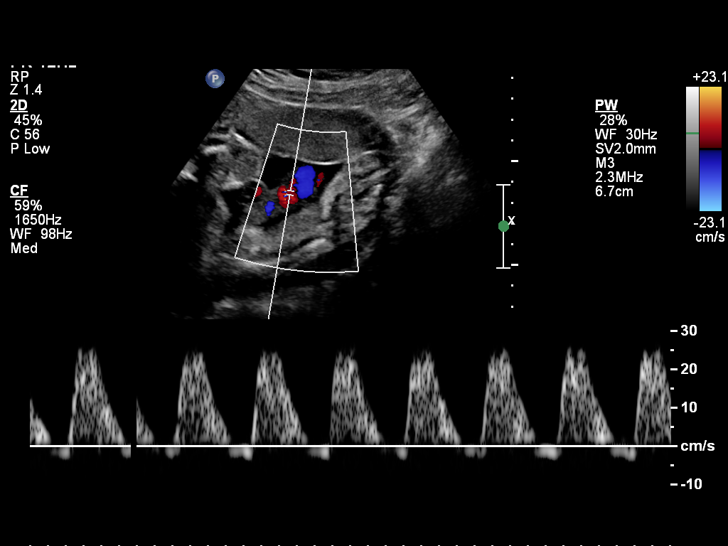
[im 22/22]
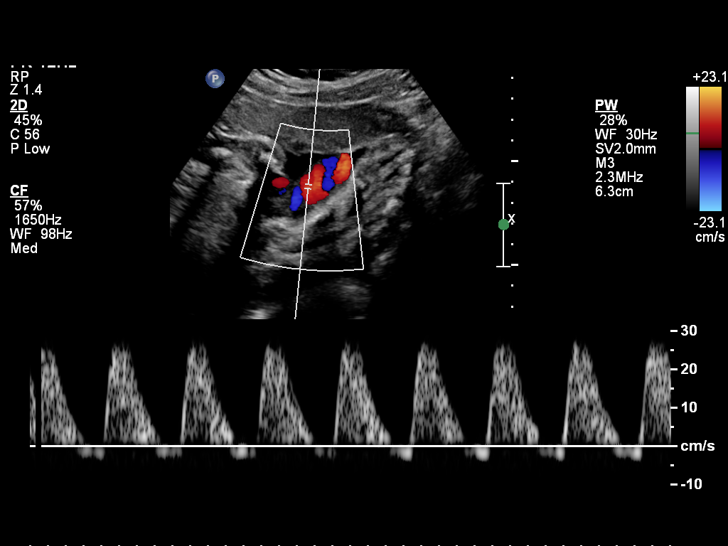

[14 of 22 positions shown; findings below may reference images not displayed]

IMPRESSION: See AS Obstetric US report.

## 2012-11-27 ENCOUNTER — Ambulatory Visit: Payer: Self-pay | Admitting: Family Medicine

## 2012-11-27 ENCOUNTER — Ambulatory Visit (INDEPENDENT_AMBULATORY_CARE_PROVIDER_SITE_OTHER): Payer: 59 | Admitting: Nurse Practitioner

## 2012-11-27 ENCOUNTER — Encounter: Payer: Self-pay | Admitting: Nurse Practitioner

## 2012-11-27 VITALS — BP 136/100 | Temp 98.7°F | Wt 174.2 lb

## 2012-11-27 DIAGNOSIS — E282 Polycystic ovarian syndrome: Secondary | ICD-10-CM

## 2012-11-27 DIAGNOSIS — J069 Acute upper respiratory infection, unspecified: Secondary | ICD-10-CM

## 2012-11-27 DIAGNOSIS — H6690 Otitis media, unspecified, unspecified ear: Secondary | ICD-10-CM

## 2012-11-27 DIAGNOSIS — H6691 Otitis media, unspecified, right ear: Secondary | ICD-10-CM

## 2012-11-27 MED ORDER — CLARITHROMYCIN 500 MG PO TABS: 500.0000 mg | ORAL_TABLET | Freq: Two times a day (BID) | ORAL | Status: DC

## 2012-11-28 ENCOUNTER — Ambulatory Visit: Payer: Self-pay | Admitting: Family Medicine

## 2012-11-29 ENCOUNTER — Encounter: Payer: Self-pay | Admitting: Nurse Practitioner

## 2012-11-29 DIAGNOSIS — E282 Polycystic ovarian syndrome: Secondary | ICD-10-CM | POA: Insufficient documentation

## 2012-11-29 NOTE — Progress Notes (Signed)
Subjective:  Presents with complaints of sinus symptoms over the past couple weeks, worse over the past 5 days. Ear pressure. Right ear pain. Facial area headache. No fever. No sore throat. Frequent cough. Yellow green nasal drainage. No wheezing. Slight chest tightness and pain with deep breath or cough. No acid reflux or heartburn. No abdominal pain. Has Mirena for birth control. Has been taking decongestants on a regular basis.  Objective:   BP 136/100  Temp(Src) 98.7 F (37.1 C) (Oral)  Wt 174 lb 3.2 oz (79.017 kg) NAD. Alert, oriented. Left TM clear effusion. Right TM yellowish effusion with moderate erythema. Pharynx injected with green PND noted. Neck supple with mild soft slightly tender adenopathy. Lungs clear. Heart regular rate rhythm. No wheezing or tachypnea. BP repeated by the nurse, no significant change.  Assessment:Otitis media, right  Acute upper respiratory infection  elevated blood pressure Plan: Meds ordered this encounter  Medications  . clarithromycin (BIAXIN) 500 MG tablet    Sig: Take 1 tablet (500 mg total) by mouth 2 (two) times daily.    Dispense:  20 tablet    Refill:  0    Order Specific Question:  Supervising Provider    Answer:  Merlyn Albert [[id number]]   OTC meds as directed for congestion. Stop all decongestants. Recheck BP outside office and call if remains elevated. Call back if symptoms worsen or persist.

## 2013-03-07 ENCOUNTER — Encounter: Payer: Self-pay | Admitting: Nurse Practitioner

## 2013-03-07 ENCOUNTER — Ambulatory Visit (INDEPENDENT_AMBULATORY_CARE_PROVIDER_SITE_OTHER): Payer: 59 | Admitting: Nurse Practitioner

## 2013-03-07 VITALS — BP 152/92 | Ht 59.0 in | Wt 165.2 lb

## 2013-03-07 DIAGNOSIS — G43909 Migraine, unspecified, not intractable, without status migrainosus: Secondary | ICD-10-CM

## 2013-03-07 DIAGNOSIS — I10 Essential (primary) hypertension: Secondary | ICD-10-CM

## 2013-03-07 DIAGNOSIS — J0100 Acute maxillary sinusitis, unspecified: Secondary | ICD-10-CM

## 2013-03-07 MED ORDER — AZITHROMYCIN 250 MG PO TABS: ORAL_TABLET | ORAL | Status: DC

## 2013-03-07 MED ORDER — PROPRANOLOL HCL ER 80 MG PO CP24: 80.0000 mg | ORAL_CAPSULE | Freq: Every day | ORAL | Status: DC

## 2013-03-08 ENCOUNTER — Encounter: Payer: Self-pay | Admitting: Nurse Practitioner

## 2013-03-08 DIAGNOSIS — G43909 Migraine, unspecified, not intractable, without status migrainosus: Secondary | ICD-10-CM | POA: Insufficient documentation

## 2013-03-08 DIAGNOSIS — I10 Essential (primary) hypertension: Secondary | ICD-10-CM | POA: Insufficient documentation

## 2013-03-08 NOTE — Assessment & Plan Note (Signed)
.   propranolol ER (INDERAL LA) 80 MG 24 hr capsule    Sig: Take 1 capsule (80 mg total) by mouth daily. For BP and migraines    Dispense:  30 capsule    Refill:  2    Order Specific Question:  Supervising Provider    Answer:  LUKING, WILLIAM S [[id number]]   OTC meds as directed for congestion. Avoid decongestants due to blood pressure. Will do a trial of Inderal to see if this will help with blood pressure and headaches. Reviewed potential adverse effects. Recheck in 3 months, call back sooner if any problems. Encouraged continued weight loss and activity. 

## 2013-03-08 NOTE — Progress Notes (Signed)
Subjective:  Presents to discuss her blood pressure. Is ready to start blood pressure medication. Has been working on her weight. Trying to stay active. Limited sodium in her diet. No chest pain shortness of breath or palpitations. No edema. States she has a history of migraine headaches, has had more of these lately. Also for the past week head congestion. Maxillary area sinus pressure. Yellow-green nasal drainage. No fever. Sore throat. No ear pain. No cough. No wheezing.  Objective:   BP 152/92  Ht 4\' 11"  (1.499 m)  Wt 165 lb 3.2 oz (74.934 kg)  BMI 33.35 kg/m2 NAD  alert, oriented. TMs clear effusion, no erythema. Pharynx injected with yellowish PND noted. Neck supple with mild soft nontender adenopathy. Lungs clear. Heart regular rate rhythm. No murmur or gallop noted. Lower extremities no edema.  Assessment:Essential hypertension, benign  Migraine  Maxillary sinusitis, acute  Plan: Meds ordered this encounter  Medications  . azithromycin (ZITHROMAX Z-PAK) 250 MG tablet    Sig: Take 2 tablets (500 mg) on  Day 1,  followed by 1 tablet (250 mg) once daily on Days 2 through 5.    Dispense:  6 each    Refill:  0    Order Specific Question:  Supervising Provider    Answer:  Merlyn Albert [[id number]]  . propranolol ER (INDERAL LA) 80 MG 24 hr capsule    Sig: Take 1 capsule (80 mg total) by mouth daily. For BP and migraines    Dispense:  30 capsule    Refill:  2    Order Specific Question:  Supervising Provider    Answer:  Merlyn Albert [[id number]]   OTC meds as directed for congestion. Avoid decongestants due to blood pressure. Will do a trial of Inderal to see if this will help with blood pressure and headaches. Reviewed potential adverse effects. Recheck in 3 months, call back sooner if any problems. Encouraged continued weight loss and activity.

## 2013-03-08 NOTE — Assessment & Plan Note (Signed)
.   propranolol ER (INDERAL LA) 80 MG 24 hr capsule    Sig: Take 1 capsule (80 mg total) by mouth daily. For BP and migraines    Dispense:  30 capsule    Refill:  2    Order Specific Question:  Supervising Provider    Answer:  Merlyn Albert [[id number]]   OTC meds as directed for congestion. Avoid decongestants due to blood pressure. Will do a trial of Inderal to see if this will help with blood pressure and headaches. Reviewed potential adverse effects. Recheck in 3 months, call back sooner if any problems. Encouraged continued weight loss and activity.

## 2013-03-14 ENCOUNTER — Telehealth: Payer: Self-pay | Admitting: Family Medicine

## 2013-03-14 MED ORDER — CLARITHROMYCIN 500 MG PO TABS: 500.0000 mg | ORAL_TABLET | Freq: Two times a day (BID) | ORAL | Status: AC

## 2013-03-14 NOTE — Telephone Encounter (Signed)
Pt was seen 8/20 for BP and sinus issues, was given a zpak for her sinuses, and wants to know if we can call in another round of antibiotics, she is still feeling congested with green mucous Yolanda Cruz

## 2013-03-14 NOTE — Telephone Encounter (Signed)
Med sent electronically to [facility]. Patient notified.

## 2013-03-14 NOTE — Telephone Encounter (Signed)
biaxin 500 bid ten d 

## 2013-03-14 NOTE — Telephone Encounter (Signed)
Left message on voicemail that med was sent to [facility] [place]

## 2013-03-21 ENCOUNTER — Telehealth: Payer: Self-pay | Admitting: Family Medicine

## 2013-03-21 NOTE — Telephone Encounter (Signed)
Notified patient Yolanda Cruz gave that specidfically to help both bp and headaches, other meds less likely to help both. Patient stated that she wanted to hold off on changing it then and she will try this med and if it doesn't work she will call back.

## 2013-03-21 NOTE — Telephone Encounter (Signed)
Propranolol ER 80 mg daily was prescribed at the 03/07/13 office visit.

## 2013-03-21 NOTE — Telephone Encounter (Signed)
Patient says that the BP meds that she was prescribed last week is too expensive and she would like something cheaper called in. Please call patient when complete.   [facility]

## 2013-03-21 NOTE — Telephone Encounter (Signed)
Yolanda Cruz gave that specidfically to help both bp and headaches, other meds less likely to help both, does she still want to change?

## 2013-05-25 ENCOUNTER — Encounter: Payer: Self-pay | Admitting: Nurse Practitioner

## 2013-05-25 ENCOUNTER — Encounter: Payer: Self-pay | Admitting: Family Medicine

## 2013-05-25 ENCOUNTER — Ambulatory Visit (INDEPENDENT_AMBULATORY_CARE_PROVIDER_SITE_OTHER): Payer: 59 | Admitting: Nurse Practitioner

## 2013-05-25 VITALS — BP 110/64 | Temp 98.4°F | Ht 58.5 in | Wt 154.0 lb

## 2013-05-25 DIAGNOSIS — J069 Acute upper respiratory infection, unspecified: Secondary | ICD-10-CM

## 2013-05-25 MED ORDER — LEVOFLOXACIN 500 MG PO TABS: 500.0000 mg | ORAL_TABLET | Freq: Every day | ORAL | Status: DC

## 2013-05-27 ENCOUNTER — Encounter: Payer: Self-pay | Admitting: Nurse Practitioner

## 2013-05-27 NOTE — Progress Notes (Signed)
Subjective:  Presents complaints of cough and congestion for the past 6 days. Frequent cough producing "rust-colored" sputum. No fever. Headache. No sore throat. Hoarseness. No ear pain. Occasional posttussive vomiting especially in the mornings. No diarrhea or abdominal pain. Slight wheezing, not enough to need an inhaler. Currently on Mirena for birth control, no menstrual cycle.  Objective:   BP 110/64  Temp(Src) 98.4 F (36.9 C) (Oral)  Ht 4' 10.5" (1.486 m)  Wt 154 lb (69.854 kg)  BMI 31.63 kg/m2 NAD. Alert, oriented. Mildly fatigued in appearance. TMs clear effusion, no erythema. Pharynx erythematous with PND noted. Neck supple with mild soft nontender adenopathy. Hoarseness noted. Lungs clear. Heart regular rate rhythm.  Assessment: Acute upper respiratory infections of unspecified site  Plan: OTC meds as directed for congestion and cough. Patient defers albuterol inhaler at this point. Call back in 72 hours if no improvement, sooner if worse. Meds ordered this encounter  Medications  . levofloxacin (LEVAQUIN) 500 MG tablet    Sig: Take 1 tablet (500 mg total) by mouth daily.    Dispense:  10 tablet    Refill:  0    Order Specific Question:  Supervising Provider    Answer:  Riccardo Dubin

## 2013-05-28 ENCOUNTER — Telehealth: Payer: Self-pay | Admitting: Family Medicine

## 2013-05-28 ENCOUNTER — Other Ambulatory Visit: Payer: Self-pay | Admitting: *Deleted

## 2013-05-28 MED ORDER — CHLORZOXAZONE 500 MG PO TABS: 500.0000 mg | ORAL_TABLET | Freq: Three times a day (TID) | ORAL | Status: DC | PRN

## 2013-05-28 MED ORDER — HYDROCODONE-HOMATROPINE 5-1.5 MG/5ML PO SYRP: 5.0000 mL | ORAL_SOLUTION | Freq: Every evening | ORAL | Status: DC | PRN

## 2013-05-28 NOTE — Telephone Encounter (Signed)
Hycodan 3 oz one tspn qhs prn cough, chlorzox azone 500 thirty one tid prn spasm

## 2013-05-28 NOTE — Telephone Encounter (Signed)
I spoke with patient about Hycodan 3 oz one tspn qhs prn cough, chlorzox azone 500 thirty one tid prn spasm. She knows to come in and pick up the Hycodan medication.

## 2013-05-28 NOTE — Telephone Encounter (Signed)
Prescription given to patient.

## 2013-05-28 NOTE — Telephone Encounter (Signed)
1) Pt calling stating that her cough is not better, wants to know if we can give her something for this to help her sleep at night. She was issues antibiotics at the appt on 11/7  2) Pt also states that she thinks she has pulled something in her back from all the coughing, can she get a muscle relaxer.

## 2013-06-08 ENCOUNTER — Ambulatory Visit: Payer: 59 | Admitting: Nurse Practitioner

## 2013-08-17 ENCOUNTER — Ambulatory Visit (INDEPENDENT_AMBULATORY_CARE_PROVIDER_SITE_OTHER): Payer: 59 | Admitting: Family Medicine

## 2013-08-17 ENCOUNTER — Encounter: Payer: Self-pay | Admitting: Family Medicine

## 2013-08-17 VITALS — BP 160/90 | Temp 98.6°F | Ht 59.0 in | Wt 163.0 lb

## 2013-08-17 DIAGNOSIS — J329 Chronic sinusitis, unspecified: Secondary | ICD-10-CM

## 2013-08-17 DIAGNOSIS — I10 Essential (primary) hypertension: Secondary | ICD-10-CM

## 2013-08-17 MED ORDER — HYDROCODONE-HOMATROPINE 5-1.5 MG/5ML PO SYRP: 5.0000 mL | ORAL_SOLUTION | Freq: Every evening | ORAL | Status: DC | PRN

## 2013-08-17 MED ORDER — CLARITHROMYCIN 500 MG PO TABS
500.0000 mg | ORAL_TABLET | Freq: Two times a day (BID) | ORAL | Status: DC
Start: 2013-08-17 — End: 2015-01-30

## 2013-08-17 NOTE — Progress Notes (Signed)
   Subjective:    Patient ID: Yolanda GeroldLauren A Cruz, female    DOB: [birth year], 28 y.o.   MRN: [id number]  Sinusitis This is a new problem. The current episode started 1 to 4 weeks ago. The problem has been waxing and waning since onset. There has been no fever. Associated symptoms include congestion, coughing, headaches and sinus pressure. (Body aches) Past treatments include acetaminophen and oral decongestants. The treatment provided no relief.    Cough productive at times  constnt congestion  Sinus pressure, husband had bronchitis  pts son had pneumonia   Nasal congestion  No nose bleeds  Green gunky discharge From nose, energy level diminished  Blood in it at times   Review of Systems  HENT: Positive for congestion and sinus pressure.   Respiratory: Positive for cough.   Neurological: Positive for headaches.       Objective:   Physical Exam  Alert moderate malaise. He HET moderate nasal congestion frontal tenderness. Blood pressure improved on repeat though not ideal 140/90. Lungs clear. Heart regular in rhythm.      Assessment & Plan:   Impression 1 rhinosinusitis #2 hypertension with noncompliance plan antibiotics prescribed. Symptomatic care discussed. Encouraged to take blood pressure medicine. [facility]

## 2013-08-20 ENCOUNTER — Telehealth: Payer: Self-pay | Admitting: Family Medicine

## 2013-08-20 NOTE — Telephone Encounter (Signed)
Pt was having ha's at presentation, biaxin does not usually cause headaches, likely assox with

## 2013-08-20 NOTE — Telephone Encounter (Signed)
Patient says she thinks that Biaxin that she was just prescribed last week is causing her to have headaches. Please advise.

## 2013-08-20 NOTE — Telephone Encounter (Signed)
Patient notified and verbalized understanding. 

## 2013-08-27 ENCOUNTER — Telehealth: Payer: Self-pay | Admitting: Family Medicine

## 2013-08-27 MED ORDER — DOXYCYCLINE HYCLATE 100 MG PO TABS: 100.0000 mg | ORAL_TABLET | Freq: Two times a day (BID) | ORAL | Status: DC

## 2013-08-27 NOTE — Telephone Encounter (Signed)
Today is last day of patients antibiotic. She is still having sinus pressure, headache, cough. Please advise.    [facility]

## 2013-08-27 NOTE — Telephone Encounter (Signed)
Doxy 100mg  bid for 10 days sent to [facility]. Pt notified on voicemail.

## 2013-08-27 NOTE — Telephone Encounter (Signed)
Doxy 100 bid ten d 

## 2013-09-10 ENCOUNTER — Telehealth: Payer: Self-pay | Admitting: Family Medicine

## 2013-09-10 MED ORDER — ALPRAZOLAM 0.5 MG PO TABS: ORAL_TABLET | ORAL | Status: DC

## 2013-09-10 NOTE — Telephone Encounter (Signed)
Pt going to be flying for the first time in 20 yrs this wednesday and wants to know  If we can call her in anything to calm her nerves?   [facility]

## 2013-09-10 NOTE — Telephone Encounter (Signed)
Xanax .5 mg num six take one one hr before flight znd every 6 hrs prn anxiety. Drowsy prec

## 2013-09-10 NOTE — Telephone Encounter (Signed)
Left message on voicemail notifying patient script was faxed to pharmacy.  

## 2014-01-09 ENCOUNTER — Telehealth: Payer: Self-pay | Admitting: Family Medicine

## 2014-01-09 NOTE — Telephone Encounter (Signed)
Refill on xanax taking a trip on a plane, she needs ten tablets this time. If needs appointment please let her know. [facility] [place]

## 2014-01-09 NOTE — Telephone Encounter (Signed)
ok 

## 2014-01-09 NOTE — Telephone Encounter (Signed)
Last seen 1/15 for sinus infxn

## 2014-01-10 MED ORDER — ALPRAZOLAM 0.5 MG PO TABS: ORAL_TABLET | ORAL | Status: DC

## 2014-01-10 NOTE — Telephone Encounter (Signed)
Notified patient via VM stating we sent in the new med. 

## 2014-01-10 NOTE — Addendum Note (Signed)
Addended byOneal Deputy: MCMANUS, KACEY D on: 01/10/2014 10:16 AM   Modules accepted: Orders

## 2014-09-23 ENCOUNTER — Telehealth: Payer: Self-pay | Admitting: Family Medicine

## 2014-09-23 MED ORDER — ALPRAZOLAM 0.5 MG PO TABS: 0.5000 mg | ORAL_TABLET | Freq: Four times a day (QID) | ORAL | Status: DC | PRN

## 2014-09-23 NOTE — Telephone Encounter (Signed)
Patient notified will need to be seen before any further refills. [facility] Patient verbalized understanding.

## 2014-09-23 NOTE — Addendum Note (Signed)
Addended by: Dereck LigasJOHNSON, Freedom Lopezperez P on: 09/23/2014 04:04 PM   Modules accepted: Orders

## 2014-09-23 NOTE — Telephone Encounter (Signed)
Let her know controlled substance should not be called in after this long with out seeing , but this time only numb 10 one q 6hrs prn drowsy prec nor ref

## 2014-09-23 NOTE — Telephone Encounter (Signed)
Last seen 08/07/13.

## 2014-09-23 NOTE — Telephone Encounter (Signed)
Patient wanted a prescription for xanax 0.5 is leaving flying out on business trip last seen 01/10/14 I explained that she would need appointment since its been so long since was seen. She stated couldn't come in.

## 2014-10-09 ENCOUNTER — Telehealth: Payer: Self-pay | Admitting: Family Medicine

## 2014-10-09 MED ORDER — OSELTAMIVIR PHOSPHATE 75 MG PO CAPS: 75.0000 mg | ORAL_CAPSULE | Freq: Two times a day (BID) | ORAL | Status: AC

## 2014-10-09 NOTE — Telephone Encounter (Signed)
Pt's husband was diagnosed with the flu on Monday and wife is now experiencing  The same symptoms. Pt wants to know if something can be called in.   [facility]

## 2014-10-09 NOTE — Telephone Encounter (Signed)
Left message on voicemail notifying patient [facility]

## 2014-10-09 NOTE — Telephone Encounter (Signed)
tamiflu 75 bid five d 

## 2014-10-09 NOTE — Telephone Encounter (Signed)
Patient states that she is having body aches, chills, fever, nausea, congestion and sore throat. Started at 3 am this morning.

## 2015-01-30 ENCOUNTER — Ambulatory Visit (INDEPENDENT_AMBULATORY_CARE_PROVIDER_SITE_OTHER): Payer: 59 | Admitting: Nurse Practitioner

## 2015-01-30 ENCOUNTER — Encounter: Payer: Self-pay | Admitting: Nurse Practitioner

## 2015-01-30 VITALS — BP 138/88 | Ht 59.0 in | Wt 192.8 lb

## 2015-01-30 DIAGNOSIS — N912 Amenorrhea, unspecified: Secondary | ICD-10-CM

## 2015-01-30 DIAGNOSIS — I10 Essential (primary) hypertension: Secondary | ICD-10-CM | POA: Diagnosis not present

## 2015-01-30 DIAGNOSIS — G4440 Drug-induced headache, not elsewhere classified, not intractable: Secondary | ICD-10-CM | POA: Diagnosis not present

## 2015-01-30 DIAGNOSIS — G43009 Migraine without aura, not intractable, without status migrainosus: Secondary | ICD-10-CM

## 2015-01-30 DIAGNOSIS — T3995XA Adverse effect of unspecified nonopioid analgesic, antipyretic and antirheumatic, initial encounter: Secondary | ICD-10-CM

## 2015-01-30 DIAGNOSIS — E282 Polycystic ovarian syndrome: Secondary | ICD-10-CM

## 2015-01-30 MED ORDER — LISINOPRIL 5 MG PO TABS: 5.0000 mg | ORAL_TABLET | Freq: Every day | ORAL | Status: DC

## 2015-01-30 MED ORDER — TOPIRAMATE 50 MG PO TABS: ORAL_TABLET | ORAL | Status: DC

## 2015-01-31 ENCOUNTER — Encounter: Payer: Self-pay | Admitting: Nurse Practitioner

## 2015-01-31 DIAGNOSIS — G4440 Drug-induced headache, not elsewhere classified, not intractable: Secondary | ICD-10-CM | POA: Insufficient documentation

## 2015-01-31 DIAGNOSIS — T3995XA Adverse effect of unspecified nonopioid analgesic, antipyretic and antirheumatic, initial encounter: Secondary | ICD-10-CM

## 2015-01-31 DIAGNOSIS — N912 Amenorrhea, unspecified: Secondary | ICD-10-CM | POA: Insufficient documentation

## 2015-01-31 LAB — HEMOGLOBIN A1C
ESTIMATED AVERAGE GLUCOSE: 117 mg/dL
HEMOGLOBIN A1C: 5.7 % — AB (ref 4.8–5.6)

## 2015-01-31 LAB — TSH: TSH: 2.35 u[IU]/mL (ref 0.450–4.500)

## 2015-01-31 LAB — HCG, SERUM, QUALITATIVE: hCG,Beta Subunit,Qual,Serum: NEGATIVE m[IU]/mL (ref <6)

## 2015-01-31 NOTE — Progress Notes (Signed)
Subjective:  Presents for several issues. Having migraine headaches about twice per week. Current headache is been going on for about 9 days. Starts in the back of the head that radiates to the occipital area. Slight photosensitivity. Sensitive to sound. Visual changes only when she feels syncopal. Has had a couple of passing out spells over time. Occasional nausea and vomiting. Some dizziness. Taking Advil migraine 2 pills every 3 hours every day. BP outside the office has been running very high. At the five-year point with her Mirena, due to have it replaced in September. No cycle long-term. Gets lab work to her gynecologist. Has been eating healthy and working out, having a great deal of difficulty losing weight.  Objective:   BP 138/88 mmHg  Ht 4\' 11"  (1.499 m)  Wt 192 lb 12.8 oz (87.454 kg)  BMI 38.92 kg/m2 NAD. Alert, oriented. TMs normal limit. Pharynx clear. Neck supple with minimal adenopathy. Lungs clear. Heart regular rate rhythm. BP on recheck left arm sitting 158/102. Lower extremities no edema. Thyroid nontender to palpation, no goiter or mass noted.  Assessment:  Problem List Items Addressed This Visit      Cardiovascular and Mediastinum   Essential hypertension, benign - Primary   Relevant Medications   lisinopril (PRINIVIL,ZESTRIL) 5 MG tablet   Other Relevant Orders   TSH (Completed)   Migraine   Relevant Medications   Ibuprofen (ADVIL MIGRAINE PO)   topiramate (TOPAMAX) 50 MG tablet   lisinopril (PRINIVIL,ZESTRIL) 5 MG tablet   Other Relevant Orders   TSH (Completed)     Endocrine   PCOS (polycystic ovarian syndrome) (Chronic)   Relevant Orders   TSH (Completed)   Hemoglobin A1c (Completed)     Other   Amenorrhea   Relevant Orders   TSH (Completed)   hCG, serum, qualitative (Completed)   Analgesic rebound headache   Relevant Medications   Ibuprofen (ADVIL MIGRAINE PO)   topiramate (TOPAMAX) 50 MG tablet      Plan:  Meds ordered this encounter   Medications  . Ibuprofen (ADVIL MIGRAINE PO)    Sig: Take by mouth.  . topiramate (TOPAMAX) 50 MG tablet    Sig: 1/2 tab po qhs x 6 d then one po qhs    Dispense:  30 tablet    Refill:  0    Order Specific Question:  Supervising Provider    Answer:  Merlyn AlbertLUKING, WILLIAM S [[id number]]  . lisinopril (PRINIVIL,ZESTRIL) 5 MG tablet    Sig: Take 1 tablet (5 mg total) by mouth daily.    Dispense:  30 tablet    Refill:  2    Order Specific Question:  Supervising Provider    Answer:  Riccardo DubinLUKING, WILLIAM S [[id number]]   Lab work pending. Start topiramate for migraine and lisinopril for blood pressure. Keep migraine headache diary. Warning signs reviewed. Call or go to ED if symptoms worsen. Use backup method of birth control while on topiramate, discussed risks associated with pregnancy. Also begin to wean off all OTC analgesics. Return in about 4 weeks (around 02/27/2015) for recheck migraines. Call back sooner if needed.

## 2015-02-25 ENCOUNTER — Ambulatory Visit (INDEPENDENT_AMBULATORY_CARE_PROVIDER_SITE_OTHER): Payer: 59 | Admitting: Nurse Practitioner

## 2015-02-25 ENCOUNTER — Encounter: Payer: Self-pay | Admitting: Nurse Practitioner

## 2015-02-25 VITALS — BP 138/84 | Ht 59.0 in | Wt 191.2 lb

## 2015-02-25 DIAGNOSIS — I10 Essential (primary) hypertension: Secondary | ICD-10-CM

## 2015-02-25 DIAGNOSIS — G43009 Migraine without aura, not intractable, without status migrainosus: Secondary | ICD-10-CM | POA: Diagnosis not present

## 2015-02-25 MED ORDER — TOPIRAMATE 50 MG PO TABS: ORAL_TABLET | ORAL | Status: DC

## 2015-02-25 MED ORDER — PHENTERMINE HCL 37.5 MG PO TABS: 37.5000 mg | ORAL_TABLET | Freq: Every day | ORAL | Status: DC

## 2015-02-25 NOTE — Progress Notes (Signed)
Subjective:  Presents for recheck on her migraine headaches. Has her migraine headache diary with her today. Shows that she had daily headaches mid July they gradually decreased over time, her last migraine was on 7/30. Doing well on Topamax. Denies any adverse effects. Would also like to restart phentermine. Gets regular physicals and follow-up with her gynecologist for her PCOS.  Objective:   BP 138/84 mmHg  Ht  (1.499 m)  Wt 191 lb 4 oz (86.75 kg)  BMI 38.61 kg/m2 NAD. Alert, oriented. Lungs clear. Heart regular rate rhythm.  Assessment:  Problem List Items Addressed This Visit      Cardiovascular and Mediastinum   Essential hypertension, benign   Migraine - Primary   Relevant Medications   topiramate (TOPAMAX) 50 MG tablet     Other   Morbid obesity   Relevant Medications   phentermine (ADIPEX-P) 37.5 MG tablet     Plan:  Meds ordered this encounter  Medications  . topiramate (TOPAMAX) 50 MG tablet    Sig: one po qhs for migraines    Dispense:  30 tablet    Refill:  5    Order Specific Question:  Supervising Provider    Answer:  Merlyn Albert [[id number]]  . phentermine (ADIPEX-P) 37.5 MG tablet    Sig: Take 1 tablet (37.5 mg total) by mouth daily before breakfast.    Dispense:  30 tablet    Refill:  2    Order Specific Question:  Supervising Provider    Answer:  Merlyn Albert [[id number]]   Continue Topamax as directed. Restart phentermine. Note patient has taken this without difficulty in the past. Discussed importance of weight loss in correcting PCOS and help to prevent diabetes in the future. Recheck in 3 months if she wishes to continue phentermine. Otherwise follow-up in 6 months.

## 2015-02-25 NOTE — Patient Instructions (Signed)
Nexplanon Depo Provera Nuvaring Low dose birth control pills Gastric sleeve

## 2015-05-08 ENCOUNTER — Encounter: Payer: Self-pay | Admitting: Family Medicine

## 2015-05-08 ENCOUNTER — Ambulatory Visit (INDEPENDENT_AMBULATORY_CARE_PROVIDER_SITE_OTHER): Payer: 59 | Admitting: Family Medicine

## 2015-05-08 VITALS — Temp 98.5°F | Ht 59.0 in | Wt 183.8 lb

## 2015-05-08 DIAGNOSIS — I889 Nonspecific lymphadenitis, unspecified: Secondary | ICD-10-CM

## 2015-05-08 DIAGNOSIS — J329 Chronic sinusitis, unspecified: Secondary | ICD-10-CM

## 2015-05-08 MED ORDER — ALPRAZOLAM 0.5 MG PO TABS: 0.5000 mg | ORAL_TABLET | Freq: Four times a day (QID) | ORAL | Status: DC | PRN

## 2015-05-08 MED ORDER — CLARITHROMYCIN 500 MG PO TABS: 500.0000 mg | ORAL_TABLET | Freq: Two times a day (BID) | ORAL | Status: DC

## 2015-05-08 NOTE — Progress Notes (Signed)
   Subjective:    Patient ID: Yolanda Cruz, female    DOB: 1985-12-18, 29 y.o.   MRN: [id number]  Cough This is a new problem. The current episode started in the past 7 days. Associated symptoms include ear pain, myalgias, nasal congestion and a sore throat.   Felt lousy Monday  Son had recent flare of croup tue hit hard, dim enrgy  achey throat hurts ear hurt joints hurt   Feels very achey dim enrygy  Coughs up gunky  Slight fever 100.3  Review of Systems  HENT: Positive for ear pain and sore throat.   Respiratory: Positive for cough.   Musculoskeletal: Positive for myalgias.   ROS otherwise negative    Objective:   Physical Exam  Alert mild malaise pharynx erythematous tender anterior cervical nodes neck supple lungs clear heart regular rhythm abdomen benign joints good range of motion      Assessment & Plan:  Impression cervical lymphadenitis/rhinosinusitis plan antibiotics prescribed. Symptom care discussed warning signs discussed WSL

## 2015-05-12 ENCOUNTER — Telehealth: Payer: Self-pay | Admitting: Family Medicine

## 2015-05-12 MED ORDER — DOXYCYCLINE HYCLATE 100 MG PO TABS: 100.0000 mg | ORAL_TABLET | Freq: Two times a day (BID) | ORAL | Status: DC

## 2015-05-12 NOTE — Telephone Encounter (Signed)
Patient was seen 10/20 and given biaxin 500 mg for Rhinosinustis and now has blister on her tongue.She states now sure if its the medication or the virus but wants something else called into [facility].

## 2015-05-12 NOTE — Telephone Encounter (Signed)
Doxy 100 bid ten highly doubt med pt running out of choies advise not to call perm allergy

## 2015-05-12 NOTE — Telephone Encounter (Signed)
Rx sent electronically to pharmacy. Patient notified. 

## 2015-05-20 ENCOUNTER — Other Ambulatory Visit: Payer: Self-pay | Admitting: Nurse Practitioner

## 2015-05-28 ENCOUNTER — Ambulatory Visit: Payer: 59 | Admitting: Nurse Practitioner

## 2015-08-20 ENCOUNTER — Telehealth: Payer: Self-pay | Admitting: Family Medicine

## 2015-08-20 NOTE — Telephone Encounter (Signed)
Pt called stating that the phentermine did not work at all and is wanting to know if Eber Jones can call in [place] for a month and see if that will work for her.     Therapist, occupational

## 2015-08-21 ENCOUNTER — Other Ambulatory Visit: Payer: Self-pay | Admitting: Nurse Practitioner

## 2015-08-21 MED ORDER — LORCASERIN HCL 10 MG PO TABS: ORAL_TABLET | ORAL | Status: DC

## 2015-08-21 NOTE — Telephone Encounter (Signed)
Wrote Rx; will be back tomorrow to sign; will also leave information and voucher up front.

## 2015-08-21 NOTE — Telephone Encounter (Signed)
Pt notified on voicemail. she can pickup script tomorrow. Script on carolyn's desk for her to sign tomorrow.

## 2015-09-09 ENCOUNTER — Telehealth: Payer: Self-pay | Admitting: Family Medicine

## 2015-09-09 NOTE — Telephone Encounter (Signed)
Please let patient know her medicine for weight loss was denied; she can check with them and see if another med is covered and let us know.

## 2015-09-09 NOTE — Telephone Encounter (Signed)
Rx for pt's Lorcaserin HCl (BELVIQ) 10 MG TABS was DENIED, it's a plan exclusion, see denial in yellow folder, please advise

## 2015-09-09 NOTE — Telephone Encounter (Signed)
L[facility]C 09/09/15 

## 2015-09-11 ENCOUNTER — Other Ambulatory Visit: Payer: Self-pay | Admitting: Nurse Practitioner

## 2015-09-11 MED ORDER — LORCASERIN HCL 10 MG PO TABS: ORAL_TABLET | ORAL | Status: DC

## 2015-09-11 NOTE — Telephone Encounter (Signed)
Pt states pharm told her they have a coupon she can use if they rx is written for a whole month instead of 15 days. Are you ok with rewriting script?

## 2015-09-11 NOTE — Telephone Encounter (Signed)
Pt notified on vm that script was sent to [facility].

## 2015-09-11 NOTE — Telephone Encounter (Signed)
New Rx printed for 30 days; does she still want it sent to [facility]?

## 2016-03-29 ENCOUNTER — Ambulatory Visit (INDEPENDENT_AMBULATORY_CARE_PROVIDER_SITE_OTHER): Payer: 59 | Admitting: Nurse Practitioner

## 2016-03-29 ENCOUNTER — Encounter: Payer: Self-pay | Admitting: Nurse Practitioner

## 2016-03-29 VITALS — BP 138/86 | Ht 59.0 in | Wt 194.0 lb

## 2016-03-29 DIAGNOSIS — Z789 Other specified health status: Secondary | ICD-10-CM | POA: Diagnosis not present

## 2016-03-29 DIAGNOSIS — I10 Essential (primary) hypertension: Secondary | ICD-10-CM | POA: Diagnosis not present

## 2016-03-29 MED ORDER — VALSARTAN 160 MG PO TABS: 160.0000 mg | ORAL_TABLET | Freq: Every day | ORAL | 5 refills | Status: DC

## 2016-03-29 NOTE — Progress Notes (Signed)
Subjective:  Presents with paper work for her wellness program at work. No migraines so has stopped her meds. Not on weight loss med at this time. Has been taking Dayquil and Nyquil for a cold. No added salt to her diet. Does not plan pregnancy, in fact considering tubal ligation. No known family history of HTN. Gets regular physicals with GYN.   Objective:   BP 138/86   Ht 4\' 11"  (1.499 m)   Wt 194 lb (88 kg)   BMI 39.18 kg/m  NAD. Alert, oriented. Lungs clear. Heart RRR. Carotids no bruits or thrills. Lower extremities no edema. BP recheck right arm sitting 164/102.   Assessment:  Problem List Items Addressed This Visit      Cardiovascular and Mediastinum   Essential hypertension, benign   Relevant Medications   valsartan (DIOVAN) 160 MG tablet   Other Relevant Orders   Lipid panel   Basic metabolic panel   Hemoglobin A1c    Other Visit Diagnoses    Participant in health and wellness plan    -  Primary   Relevant Orders   Lipid panel   Hemoglobin A1c     Plan: start Diovan as directed. Check BP outside of office. Call back if remains above 140/90. Discussed low sodium diet, weight loss, activity and stress reduction. Recheck in 4-6 weeks, sooner if any problems. Recommend holding on OTC meds that have decongestants.

## 2016-03-30 LAB — BASIC METABOLIC PANEL
BUN/Creatinine Ratio: 14 (ref 9–23)
BUN: 11 mg/dL (ref 6–20)
CALCIUM: 9.6 mg/dL (ref 8.7–10.2)
CO2: 21 mmol/L (ref 18–29)
CREATININE: 0.81 mg/dL (ref 0.57–1.00)
Chloride: 100 mmol/L (ref 96–106)
GFR, EST AFRICAN AMERICAN: 113 mL/min/{1.73_m2} (ref >59)
GFR, EST NON AFRICAN AMERICAN: 98 mL/min/{1.73_m2} (ref >59)
Glucose: 87 mg/dL (ref 65–99)
Potassium: 4 mmol/L (ref 3.5–5.2)
Sodium: 141 mmol/L (ref 134–144)

## 2016-03-30 LAB — HEMOGLOBIN A1C
ESTIMATED AVERAGE GLUCOSE: 111 mg/dL
Hgb A1c MFr Bld: 5.5 % (ref 4.8–5.6)

## 2016-03-30 LAB — LIPID PANEL
CHOLESTEROL TOTAL: 205 mg/dL — AB (ref 100–199)
Chol/HDL Ratio: 4.6 ratio units — ABNORMAL HIGH (ref 0.0–4.4)
HDL: 45 mg/dL (ref >39)
LDL Calculated: 128 mg/dL — ABNORMAL HIGH (ref 0–99)
TRIGLYCERIDES: 160 mg/dL — AB (ref 0–149)
VLDL Cholesterol Cal: 32 mg/dL (ref 5–40)

## 2016-04-27 ENCOUNTER — Telehealth: Payer: Self-pay | Admitting: Nurse Practitioner

## 2016-04-27 NOTE — Telephone Encounter (Signed)
I recommend rechecking here before prescribing as a precaution since it was so high in our office last time. I will recheck her BP at her visit and if ok we will go ahead with Phentermine.

## 2016-04-27 NOTE — Telephone Encounter (Signed)
Discussed with pt. Pt verbalized understanding.  °

## 2016-04-27 NOTE — Telephone Encounter (Signed)
Patient had her yearly physical done at her OBGYN the other day and her blood pressure was 131/89.  She wants to know if she still needs to see Yolanda Cruz on 05/03/16. She said she was supposed to be seen that day to see if she would qualify to be prescribed phentermine.

## 2016-05-03 ENCOUNTER — Encounter: Payer: Self-pay | Admitting: Nurse Practitioner

## 2016-05-03 ENCOUNTER — Ambulatory Visit (INDEPENDENT_AMBULATORY_CARE_PROVIDER_SITE_OTHER): Payer: 59 | Admitting: Nurse Practitioner

## 2016-05-03 VITALS — BP 112/80 | Ht 59.0 in | Wt 196.8 lb

## 2016-05-03 DIAGNOSIS — I10 Essential (primary) hypertension: Secondary | ICD-10-CM

## 2016-05-03 MED ORDER — PHENTERMINE HCL 37.5 MG PO TABS: 37.5000 mg | ORAL_TABLET | Freq: Every day | ORAL | 2 refills | Status: DC

## 2016-05-03 NOTE — Progress Notes (Signed)
Subjective:  Presents for recheck on her blood pressure. Doing much better. BP at home 130s over 80s. A rare brief fluttery sensation in the lower chest area localized only occurs when she bends over. Occurs about 2 times per week. Definitely lasts less than a minute. No shortness of breath or fainting feeling. Has been getting on the treadmill working out without difficulty. Has cut back on her fast food. Plans to cut back some on her sweet tea.   Objective:   BP 112/80   Ht 4\' 11"  (1.499 m)   Wt 196 lb 12.8 oz (89.3 kg)   BMI 39.75 kg/m  NAD. Alert, oriented. Lungs clear. Heart regular rate rhythm. No murmur or gallop noted. Lower extremities no edema.  Assessment:  Problem List Items Addressed This Visit      Cardiovascular and Mediastinum   Essential hypertension, benign - Primary     Other   Morbid obesity (HCC)   Relevant Medications   phentermine (ADIPEX-P) 37.5 MG tablet    Other Visit Diagnoses   None.    Plan:  Meds ordered this encounter  Medications  . phentermine (ADIPEX-P) 37.5 MG tablet    Sig: Take 1 tablet (37.5 mg total) by mouth daily before breakfast.    Dispense:  30 tablet    Refill:  2    Order Specific Question:   Supervising Provider    Answer:   Merlyn AlbertLUKING, WILLIAM S [[id number]]   Continued activity. Cut back on sugar intake. Restart phentermine as directed. DC med and call if any problems including palpitations. Return in about 3 months (around 08/03/2016).

## 2016-07-07 ENCOUNTER — Telehealth: Payer: Self-pay | Admitting: Family Medicine

## 2016-07-07 MED ORDER — OSELTAMIVIR PHOSPHATE 75 MG PO CAPS: 75.0000 mg | ORAL_CAPSULE | Freq: Two times a day (BID) | ORAL | 0 refills | Status: DC

## 2016-07-07 NOTE — Telephone Encounter (Signed)
Pt is in [place] and is needing tamiflu sent in. Pt's child was diagnosed with flu a.   [facility] [place] [phone]

## 2016-07-07 NOTE — Telephone Encounter (Signed)
Consult with Dr Brett CanalesSteve. Tamiflu 75 mg #10 one tablet BID for 5 days.

## 2016-07-07 NOTE — Telephone Encounter (Signed)
Prescription sent electronically to pharmacy. Patient notified. 

## 2016-11-25 ENCOUNTER — Encounter: Payer: Self-pay | Admitting: Family Medicine

## 2016-12-10 ENCOUNTER — Telehealth: Payer: Self-pay | Admitting: Family Medicine

## 2016-12-10 ENCOUNTER — Other Ambulatory Visit: Payer: Self-pay | Admitting: *Deleted

## 2016-12-10 MED ORDER — ALPRAZOLAM 0.25 MG PO TABS: 0.2500 mg | ORAL_TABLET | Freq: Two times a day (BID) | ORAL | 0 refills | Status: DC | PRN

## 2016-12-10 NOTE — Telephone Encounter (Signed)
Patient has to fly on an airplane for her father-in-law's funeral and she said she doesn't fly well.  She is requesting Rx for alprazolam.  [facility]

## 2016-12-10 NOTE — Telephone Encounter (Signed)
Discussed with pt. Pt verbalized understanding. Med faxed to pharm.

## 2016-12-10 NOTE — Telephone Encounter (Signed)
Xanax 0.25 mg 1 twice a day when necessary anxiety, #10, no refills, cautioned drowsiness-do not drive while on medication

## 2017-01-02 ENCOUNTER — Other Ambulatory Visit: Payer: Self-pay | Admitting: Nurse Practitioner

## 2017-01-03 NOTE — Telephone Encounter (Signed)
One month plus one mo ref

## 2017-02-08 ENCOUNTER — Telehealth: Payer: Self-pay | Admitting: *Deleted

## 2017-03-07 ENCOUNTER — Encounter: Payer: Self-pay | Admitting: Family Medicine

## 2024-02-14 ENCOUNTER — Ambulatory Visit: Payer: Self-pay | Attending: Internal Medicine | Admitting: Cardiology

## 2024-02-14 ENCOUNTER — Encounter: Payer: Self-pay | Admitting: Cardiology

## 2024-02-14 ENCOUNTER — Other Ambulatory Visit (HOSPITAL_COMMUNITY): Payer: Self-pay

## 2024-02-14 VITALS — BP 128/90 | HR 91 | Resp 18 | Ht 59.0 in | Wt 213.4 lb

## 2024-02-14 DIAGNOSIS — R0683 Snoring: Secondary | ICD-10-CM

## 2024-02-14 DIAGNOSIS — Z6841 Body Mass Index (BMI) 40.0 and over, adult: Secondary | ICD-10-CM

## 2024-02-14 DIAGNOSIS — I10 Essential (primary) hypertension: Secondary | ICD-10-CM

## 2024-02-14 DIAGNOSIS — R002 Palpitations: Secondary | ICD-10-CM

## 2024-02-14 DIAGNOSIS — E66813 Obesity, class 3: Secondary | ICD-10-CM | POA: Diagnosis not present

## 2024-02-14 MED ORDER — OLMESARTAN MEDOXOMIL-HCTZ 40-12.5 MG PO TABS
1.0000 | ORAL_TABLET | ORAL | 1 refills | Status: AC
  Filled 2024-02-14: qty 30, 30d supply, fill #0

## 2024-02-14 NOTE — Patient Instructions (Signed)

## 2024-02-14 NOTE — Progress Notes (Signed)
 Cardiology Office Note:  .   Date:  02/14/2024  ID:  Yolanda Cruz, DOB Jul 30, 1985, MRN [id number] PCP: Lynwood Laneta LELON DEVONNA   [facility] Providers Cardiologist:  Gordy Bergamo, MD   History of Present Illness: .   Yolanda Cruz is a 38 y.o. patient with polycystic ovarian syndrome, morbid obesity, hypertension presents to establish care due to LVH noted by echocardiogram.  Discussed the use of AI scribe software for clinical note transcription with the patient, who gave verbal consent to proceed.  History of Present Illness Yolanda Cruz is a 38 year old female with hypertension who presents for cardiovascular evaluation.  She has hypertension, initially identified after episodes of severe epistaxis, leading to hospital admission due to extremely high blood pressure. Her home blood pressure readings are typically in the 130s/90s, rising to the 140s/100s with physical activity. She takes losartan but no diuretics.  She experiences palpitations, described as 'fluttering' or 'excessive thump', particularly at rest. She has a heart murmur and was informed of left ventricular hypertrophy after moving to her current location. She has no chest pain or shortness of breath but reports chronic palpitations and occasional dizziness, particularly when exposed to high heat.   Labs   ROS  Review of Systems  Cardiovascular:  Positive for palpitations. Negative for chest pain, dyspnea on exertion and leg swelling.   Physical Exam:   VS:  BP (!) 128/90 (BP Location: Left Arm, Patient Position: Sitting, Cuff Size: Large)   Pulse 91   Resp 18   Ht 4' 11 (1.499 m)   Wt 213 lb 6.4 oz (96.8 kg)   SpO2 96%   BMI 43.10 kg/m    Wt Readings from Last 3 Encounters:  02/14/24 213 lb 6.4 oz (96.8 kg)  05/03/16 196 lb 12.8 oz (89.3 kg)  03/29/16 194 lb (88 kg)    Physical Exam Neck:     Vascular: No carotid bruit or JVD.  Cardiovascular:     Rate and Rhythm: Normal rate and regular rhythm.      Pulses: Intact distal pulses.     Heart sounds: S1 normal and S2 normal. Murmur heard.     Early systolic murmur is present with a grade of 2/6 at the upper right sternal border.     No gallop.  Pulmonary:     Effort: Pulmonary effort is normal.     Breath sounds: Normal breath sounds.  Abdominal:     General: Bowel sounds are normal.     Palpations: Abdomen is soft.  Musculoskeletal:     Right lower leg: No edema.     Left lower leg: No edema.    Studies Reviewed: SABRA     EKG:    EKG Interpretation Date/Time:  Tuesday February 14 2024 15:36:23 EDT Ventricular Rate:  93 PR Interval:  152 QRS Duration:  76 QT Interval:  338 QTC Calculation: 420 R Axis:   -5  Text Interpretation: EKG 02/14/2024: Normal sinus rhythm with rate of 93 bpm, normal axis, no evidence of ischemia.  Minimal voltage criteria for LVH in aVL.  No prior EKG to compare. Confirmed by Mella Inclan, Jagadeesh ([id number]) on 02/14/2024 4:01:26 PM    Medications ordered    Meds ordered this encounter  Medications   olmesartan -hydrochlorothiazide  (BENICAR  HCT) 40-12.5 MG tablet    Sig: Take 1 tablet by mouth every morning.    Dispense:  30 tablet    Refill:  1    Refills to Natalie James, PA-C  ASSESSMENT AND PLAN: .      ICD-10-CM   1. Primary hypertension  I10 EKG 12-Lead    olmesartan -hydrochlorothiazide  (BENICAR  HCT) 40-12.5 MG tablet    2. Palpitations  R00.2     3. Class 3 severe obesity due to excess calories without serious comorbidity with body mass index (BMI) of 40.0 to 44.9 in adult  E66.813    Z68.41     4. Loud snoring  R06.83       Assessment and Plan Assessment & Plan Hypertension with left ventricular hypertrophy Hypertension with LVH likely secondary to hypertension. Current home blood pressure readings are typically in the 130s/90s, occasionally reaching 140s/100s. No symptoms of chest pain or shortness of breath. Palpitations present but considered low risk. Current medication regimen  may not be sufficient to achieve target blood pressure of 130/75, especially given comorbidities such as hyperglycemia and obesity. - Discontinue losartan. - Prescribe omisartan HCT 40/12.5 mg once daily in the morning. - Advise to monitor blood pressure at home. - Schedule blood work in three weeks to assess kidney function and other parameters with your PCP, patient has complete physical coming up in 3 weeks. - Consider adding another antihypertensive medication if target blood pressure is not achieved, <130/75 mmHg  Morbid obesity Morbid obesity with difficulty losing weight despite regular exercise and dietary modifications. Insurance does not cover GLP-1 agonists due to lack of diabetes diagnosis. Discussed alternative options for weight management, including self-pay programs for medications like Mounjaro from [employer] or Novo Nordisk. - Discuss self-pay options for GLP-1 agonists with primary care provider. - Consider referral to pharmacist for cost-saving strategies if needed. - Continue regular exercise and dietary modifications.  Polycystic ovary syndrome with hyperglycemia and insulin resistance PCOS with associated hyperglycemia and insulin resistance, currently managed with metformin. Insurance does not cover GLP-1 agonists due to lack of diabetes diagnosis. Weight management remains a challenge, potentially exacerbating insulin resistance. - Continue metformin. - Discuss self-pay options for GLP-1 agonists with primary care provider.  Palpitations and cardiac murmur Chronic palpitations with recent increase in frequency, described as excessive thumping. Associated with PVCs and PACs, likely due to obesity and poorly controlled hypertension. No family history of sudden cardiac death. Long-standing heart murmur present, not considered concerning. Soft systolic murmur of no clinical concern, I do not have a recent echocardiogram report, patient will try to get this send try to upload it  into epic.  PCP can also forwarded to me for evaluation if there is any significant valvular abnormality.  I do not suspect she has significant valve abnormality or her LVH related to hypertension is of much clinical concern as long as hypertension is well treated.  Suspected obstructive sleep apnea Suspected obstructive sleep apnea due to loud snoring and obesity. Sleep apnea may contribute to hypertension and hyperglycemia. - Recommend sleep study to evaluate for obstructive sleep apnea, to discuss with PCP..  I will see her back on a as needed basis.   Signed,  Gordy Bergamo, MD, [facility] 02/14/2024, 6:55 PM [facility] [address] [place], [place] [address] Phone: [phone]. Fax:  [phone]
